# Patient Record
Sex: Male | Born: 1975 | Race: White | Hispanic: No | Marital: Married | Smoking: Never smoker
Health system: Southern US, Community
[De-identification: ages and names within clinical notes are randomized; demographics above are authoritative.]

## PROBLEM LIST (undated history)

## (undated) DIAGNOSIS — K922 Gastrointestinal hemorrhage, unspecified: Secondary | ICD-10-CM

---

## 1998-12-23 ENCOUNTER — Emergency Department (HOSPITAL_COMMUNITY): Admission: EM | Admit: 1998-12-23 | Discharge: 1998-12-23 | Payer: Self-pay | Admitting: Emergency Medicine

## 1998-12-23 ENCOUNTER — Encounter: Payer: Self-pay | Admitting: Emergency Medicine

## 2018-07-21 ENCOUNTER — Emergency Department (HOSPITAL_COMMUNITY): Payer: No Typology Code available for payment source

## 2018-07-21 ENCOUNTER — Emergency Department (HOSPITAL_COMMUNITY)
Admission: EM | Admit: 2018-07-21 | Discharge: 2018-07-21 | Disposition: A | Discharge status: Home / Self Care | Payer: No Typology Code available for payment source | Attending: Emergency Medicine | Admitting: Emergency Medicine

## 2018-07-21 ENCOUNTER — Encounter (HOSPITAL_COMMUNITY): Payer: Self-pay | Admitting: Emergency Medicine

## 2018-07-21 DIAGNOSIS — S0181XA Laceration without foreign body of other part of head, initial encounter: Secondary | ICD-10-CM

## 2018-07-21 DIAGNOSIS — S022XXA Fracture of nasal bones, initial encounter for closed fracture: Secondary | ICD-10-CM

## 2018-07-21 DIAGNOSIS — S01312A Laceration without foreign body of left ear, initial encounter: Secondary | ICD-10-CM | POA: Insufficient documentation

## 2018-07-21 DIAGNOSIS — S01511A Laceration without foreign body of lip, initial encounter: Secondary | ICD-10-CM | POA: Diagnosis not present

## 2018-07-21 DIAGNOSIS — Y929 Unspecified place or not applicable: Secondary | ICD-10-CM | POA: Insufficient documentation

## 2018-07-21 DIAGNOSIS — Z23 Encounter for immunization: Secondary | ICD-10-CM | POA: Diagnosis not present

## 2018-07-21 DIAGNOSIS — Y9355 Activity, bike riding: Secondary | ICD-10-CM | POA: Diagnosis not present

## 2018-07-21 DIAGNOSIS — Y999 Unspecified external cause status: Secondary | ICD-10-CM | POA: Diagnosis not present

## 2018-07-21 LAB — CBC
HCT: 36.5 % — ABNORMAL LOW (ref 39.0–52.0)
Hemoglobin: 10.2 g/dL — ABNORMAL LOW (ref 13.0–17.0)
MCH: 20.9 pg — ABNORMAL LOW (ref 26.0–34.0)
MCHC: 27.9 g/dL — ABNORMAL LOW (ref 30.0–36.0)
MCV: 74.8 fL — ABNORMAL LOW (ref 78.0–100.0)
Platelets: 316 10*3/uL (ref 150–400)
RBC: 4.88 MIL/uL (ref 4.22–5.81)
RDW: 16 % — ABNORMAL HIGH (ref 11.5–15.5)
WBC: 12.5 10*3/uL — ABNORMAL HIGH (ref 4.0–10.5)

## 2018-07-21 LAB — BASIC METABOLIC PANEL
Anion gap: 13 (ref 5–15)
BUN: 8 mg/dL (ref 6–20)
CO2: 22 mmol/L (ref 22–32)
Calcium: 8.9 mg/dL (ref 8.9–10.3)
Chloride: 107 mmol/L (ref 98–111)
Creatinine, Ser: 0.77 mg/dL (ref 0.61–1.24)
GFR calc Af Amer: >60 mL/min (ref >60)
GFR calc non Af Amer: >60 mL/min (ref >60)
Glucose, Bld: 95 mg/dL (ref 70–99)
Potassium: 3.5 mmol/L (ref 3.5–5.1)
Sodium: 142 mmol/L (ref 135–145)

## 2018-07-21 LAB — ETHANOL: Alcohol, Ethyl (B): 118 mg/dL — ABNORMAL HIGH (ref <10)

## 2018-07-21 MED ORDER — TETANUS-DIPHTH-ACELL PERTUSSIS 5-2.5-18.5 LF-MCG/0.5 IM SUSP
INTRAMUSCULAR | Status: AC
  Filled 2018-07-21: qty 0.5

## 2018-07-21 MED ORDER — LIDOCAINE-EPINEPHRINE (PF) 2 %-1:200000 IJ SOLN
20.0000 mL | Freq: Once | INTRAMUSCULAR | Status: DC
  Filled 2018-07-21: qty 20

## 2018-07-21 MED ORDER — IBUPROFEN 600 MG PO TABS: 600.0000 mg | ORAL_TABLET | Freq: Three times a day (TID) | ORAL | 0 refills | Status: DC | PRN

## 2018-07-21 MED ORDER — CEPHALEXIN 500 MG PO CAPS: 500.0000 mg | ORAL_CAPSULE | Freq: Three times a day (TID) | ORAL | 0 refills | Status: DC

## 2018-07-21 MED ORDER — TETANUS-DIPHTH-ACELL PERTUSSIS 5-2.5-18.5 LF-MCG/0.5 IM SUSP
0.5000 mL | Freq: Once | INTRAMUSCULAR | Status: AC
  Administered 2018-07-21: 0.5 mL via INTRAMUSCULAR

## 2018-07-21 MED ORDER — HYDROCODONE-ACETAMINOPHEN 5-325 MG PO TABS: 1.0000 | ORAL_TABLET | ORAL | 0 refills | Status: DC | PRN

## 2018-07-21 NOTE — Discharge Instructions (Addendum)
Suture removal in 7 days

## 2018-07-21 NOTE — ED Notes (Signed)
Pt was unsteady when walking stand by assit when walking.
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
## 2018-07-21 NOTE — ED Notes (Signed)
Xray at bedside for portables

## 2018-07-21 NOTE — ED Notes (Signed)
Dr. Patria Maneampos at bedside for wound repair
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
## 2018-07-21 NOTE — ED Notes (Signed)
Wounds was clean and pt is ready [place].

## 2021-09-15 ENCOUNTER — Encounter (HOSPITAL_COMMUNITY): Payer: Self-pay | Admitting: Emergency Medicine

## 2021-09-15 ENCOUNTER — Emergency Department (HOSPITAL_COMMUNITY)
Admission: EM | Admit: 2021-09-15 | Discharge: 2021-09-16 | Disposition: A | Discharge status: Home / Self Care | Payer: 59 | Attending: Emergency Medicine | Admitting: Emergency Medicine

## 2021-09-15 DIAGNOSIS — D649 Anemia, unspecified: Secondary | ICD-10-CM | POA: Insufficient documentation

## 2021-09-15 DIAGNOSIS — K625 Hemorrhage of anus and rectum: Secondary | ICD-10-CM | POA: Diagnosis not present

## 2021-09-15 LAB — CBC WITH DIFFERENTIAL/PLATELET
Abs Immature Granulocytes: 0.01 10*3/uL (ref 0.00–0.07)
Basophils Absolute: 0 10*3/uL (ref 0.0–0.1)
Basophils Relative: 0 %
Eosinophils Absolute: 0.2 10*3/uL (ref 0.0–0.5)
Eosinophils Relative: 2 %
HCT: 24.8 % — ABNORMAL LOW (ref 39.0–52.0)
Hemoglobin: 6.3 g/dL — CL (ref 13.0–17.0)
Immature Granulocytes: 0 %
Lymphocytes Relative: 25 %
Lymphs Abs: 2.1 10*3/uL (ref 0.7–4.0)
MCH: 16.4 pg — ABNORMAL LOW (ref 26.0–34.0)
MCHC: 25.4 g/dL — ABNORMAL LOW (ref 30.0–36.0)
MCV: 64.8 fL — ABNORMAL LOW (ref 80.0–100.0)
Monocytes Absolute: 0.5 10*3/uL (ref 0.1–1.0)
Monocytes Relative: 7 %
Neutro Abs: 5.4 10*3/uL (ref 1.7–7.7)
Neutrophils Relative %: 66 %
Platelets: 280 10*3/uL (ref 150–400)
RBC: 3.83 MIL/uL — ABNORMAL LOW (ref 4.22–5.81)
RDW: 18.1 % — ABNORMAL HIGH (ref 11.5–15.5)
WBC: 8.2 10*3/uL (ref 4.0–10.5)
nRBC: 0 % (ref 0.0–0.2)

## 2021-09-15 LAB — COMPREHENSIVE METABOLIC PANEL
ALT: 16 U/L (ref 0–44)
AST: 17 U/L (ref 15–41)
Albumin: 4.5 g/dL (ref 3.5–5.0)
Alkaline Phosphatase: 65 U/L (ref 38–126)
Anion gap: 7 (ref 5–15)
BUN: 16 mg/dL (ref 6–20)
CO2: 25 mmol/L (ref 22–32)
Calcium: 8.9 mg/dL (ref 8.9–10.3)
Chloride: 106 mmol/L (ref 98–111)
Creatinine, Ser: 0.72 mg/dL (ref 0.61–1.24)
GFR, Estimated: >60 mL/min (ref >60)
Glucose, Bld: 100 mg/dL — ABNORMAL HIGH (ref 70–99)
Potassium: 3.8 mmol/L (ref 3.5–5.1)
Sodium: 138 mmol/L (ref 135–145)
Total Bilirubin: 0.5 mg/dL (ref 0.3–1.2)
Total Protein: 7.3 g/dL (ref 6.5–8.1)

## 2021-09-15 LAB — PREPARE RBC (CROSSMATCH)

## 2021-09-15 LAB — ABO/RH: ABO/RH(D): B POS

## 2021-09-15 MED ORDER — DIPHENHYDRAMINE HCL 50 MG/ML IJ SOLN
25.0000 mg | Freq: Once | INTRAMUSCULAR | Status: AC
  Administered 2021-09-15: 25 mg via INTRAVENOUS
  Filled 2021-09-15: qty 1

## 2021-09-15 MED ORDER — SODIUM CHLORIDE 0.9 % IV SOLN
10.0000 mL/h | Freq: Once | INTRAVENOUS | Status: AC
  Administered 2021-09-15: 10 mL/h via INTRAVENOUS

## 2021-09-15 MED ORDER — ACETAMINOPHEN 500 MG PO TABS
1000.0000 mg | ORAL_TABLET | Freq: Once | ORAL | Status: AC
  Administered 2021-09-15: 1000 mg via ORAL
  Filled 2021-09-15: qty 2
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
## 2021-09-15 NOTE — ED Provider Notes (Signed)
[facility]-EMERGENCY DEPT Provider Note   CSN: [id number] Arrival date & time: 09/15/21  1651     History Chief Complaint  Patient presents with   Abnormal Lab    Eric Petty is a 45 y.o. male.  HPI Patient presents with his mother who is present throughout, assists with history. He presents from a primary care clinic where he initially went due to fatigue over the past few months, decreased exercise capacity over about the same timeframe.  No focal weakness, no syncope, no fall, no focal pain.  He notes that he has been experiencing blood in his stool recently.  Today he had physical exam, he states for the first time in about 20 years.  Labs performed resulted after he departed.  He was notified of critical abnormality of his hemoglobin value, sent here for evaluation.    History reviewed. No pertinent past medical history.  There are no problems to display for this patient.   History reviewed. No pertinent surgical history.     History reviewed. No pertinent family history.  Social History   Tobacco Use   Smoking status: Never   Smokeless tobacco: Never  Substance Use Topics   Alcohol use: Yes   Drug use: Never    Home Medications Prior to Admission medications   Medication Sig Start Date End Date Taking? Authorizing Provider  cephALEXin (KEFLEX) 500 MG capsule Take 1 capsule (500 mg total) by mouth 3 (three) times daily. 07/21/18   Azalia Bilis, MD  HYDROcodone-acetaminophen (NORCO/VICODIN) 5-325 MG tablet Take 1 tablet by mouth every 4 (four) hours as needed for moderate pain. 07/21/18   Azalia Bilis, MD  ibuprofen (ADVIL,MOTRIN) 600 MG tablet Take 1 tablet (600 mg total) by mouth every 8 (eight) hours as needed. 07/21/18   Azalia Bilis, MD    Allergies    Penicillins  Review of Systems   Review of Systems  Constitutional:        Per HPI, otherwise negative  HENT:         Per HPI, otherwise negative  Respiratory:         Per  HPI, otherwise negative  Cardiovascular:        Per HPI, otherwise negative  Gastrointestinal:  Positive for blood in stool. Negative for abdominal pain, diarrhea, nausea and vomiting.  Endocrine:       Negative aside from HPI  Genitourinary:        Neg aside from HPI   Musculoskeletal:        Per HPI, otherwise negative  Skin: Negative.   Neurological:  Positive for weakness and light-headedness. Negative for syncope.   Physical Exam Updated Vital Signs BP 129/89   Pulse 86   Temp (!) 100.4 F (38 C) (Oral)   Resp 19   SpO2 98%   Physical Exam Vitals and nursing note reviewed.  Constitutional:      General: He is not in acute distress.    Appearance: He is well-developed.  HENT:     Head: Normocephalic and atraumatic.  Eyes:     Conjunctiva/sclera: Conjunctivae normal.  Cardiovascular:     Rate and Rhythm: Normal rate and regular rhythm.  Pulmonary:     Effort: Pulmonary effort is normal. No respiratory distress.     Breath sounds: No stridor.  Abdominal:     General: There is no distension.     Tenderness: There is no abdominal tenderness. There is no guarding.  Skin:    General:  Skin is warm and dry.     Coloration: Skin is pale.  Neurological:     Mental Status: He is alert and oriented to person, place, and time.    ED Results / Procedures / Treatments   Labs (all labs ordered are listed, but only abnormal results are displayed) Labs Reviewed  CBC WITH DIFFERENTIAL/PLATELET - Abnormal; Notable for the following components:      Result Value   RBC 3.83 (*)    Hemoglobin 6.3 (*)    HCT 24.8 (*)    MCV 64.8 (*)    MCH 16.4 (*)    MCHC 25.4 (*)    RDW 18.1 (*)    All other components within normal limits  COMPREHENSIVE METABOLIC PANEL - Abnormal; Notable for the following components:   Glucose, Bld 100 (*)    All other components within normal limits  POC OCCULT BLOOD, ED  TYPE AND SCREEN  PREPARE RBC (CROSSMATCH)  ABO/RH      Procedures Procedures   Medications Ordered in ED Medications  0.9 %  sodium chloride infusion (0 mL/hr Intravenous Stopped 09/15/21 2250)  acetaminophen (TYLENOL) tablet 1,000 mg (1,000 mg Oral Given 09/15/21 2256)    ED Course  I have reviewed the triage vital signs and the nursing notes.  Pertinent labs & imaging results that were available during my care of the patient were reviewed by me and considered in my medical decision making (see chart for details).  Update: Initial hemoglobin 6.3 consistent with outside hospital lab value 6.6.  He and I discussed indication for transfusion.  Patient will proceed.  11:16 PM Transfusion complete.  Patient has tolerated without complication, has no hypotension, no complaints, is essentially ready to go, and we had a lengthy conversation about the need for very close outpatient follow-up with gastroenterology for colonoscopy appropriate ongoing monitoring, management with his bloody stool, symptomatic anemia.  Patient is amenable to this pathway.  However, soon after completing his transfusion patient found to have mild fever, requiring Tylenol.  Pending improvement in this, with no other complications patient may still be appropriate for discharge with outpatient GI follow-up.  Otherwise physical exam reassuring, patient is awake, alert, has no focal neurodeficits, no chest pain, no dyspnea, suspicion for his GI blood loss contributing to his symptomatic anemia, and as above he is aware of need for close outpatient follow-up for evaluation of GI bleed including possibilities such as polyps, diverticulosis, and/or cancer.  Absent abdominal pain, syncope, or other acute findings, colonoscopy is next appropriate diagnostic test, no indication for CT imaging currently. MDM Number of Diagnoses or Management Options Rectal bleeding: new, needed workup Symptomatic anemia: new, needed workup   Amount and/or Complexity of Data Reviewed Clinical lab  tests: ordered and reviewed Tests in the medicine section of CPT: reviewed and ordered Decide to obtain previous medical records or to obtain history from someone other than the patient: yes Obtain history from someone other than the patient: yes Review and summarize past medical records: yes Discuss the patient with other providers: yes  Risk of Complications, Morbidity, and/or Mortality Presenting problems: high Diagnostic procedures: high Management options: high  Critical Care Total time providing critical care: 30-74 minutes (35)  Patient Progress Patient progress: stable   Final Clinical Impression(s) / ED Diagnoses Final diagnoses:  Rectal bleeding  Symptomatic anemia     Gerhard Munch, MD 09/15/21 2318

## 2021-09-15 NOTE — Discharge Instructions (Addendum)
As discussed, with your bleeding, and hemoglobin abnormalities it is very important that you follow-up with your gastroenterology team for anticipated colonoscopy as well as appropriate ongoing outpatient monitoring and management of your episodes and weakness.  Return here for concerning changes in your condition.

## 2021-09-15 NOTE — ED Notes (Signed)
Consent signed and on chart.

## 2021-09-15 NOTE — ED Provider Notes (Signed)
Emergency Medicine Provider Triage Evaluation Note  Eric Petty , a 45 y.o. male  was evaluated in triage.  Pt complains of low hgb. Had a phsyical that showed hgb of 6.6. Has occasional red blood in stool. + Ice pica. Has had several months of fatigue and weakness.   Review of Systems  Positive: weakness Negative: fever  Physical Exam  BP 129/80 (BP Location: Left Arm)   Pulse 91   Temp (!) 97.5 F (36.4 C) (Oral)   Resp 18   SpO2 96%  Gen:   Awake, no distress   Resp:  Normal effort  MSK:   Moves extremities without difficulty  Other:  pale  Medical Decision Making  Medically screening exam initiated at 5:31 PM.  Appropriate orders placed.  Eric Petty was informed that the remainder of the evaluation will be completed by another provider, this initial triage assessment does not replace that evaluation, and the importance of remaining in the ED until their evaluation is complete.  Labs ordered.   Arthor Captain, PA-C 09/15/21 1734    Charlynne Pander, MD 09/15/21 [phone]

## 2021-09-16 LAB — TYPE AND SCREEN
ABO/RH(D): B POS
Antibody Screen: NEGATIVE
Unit division: 0

## 2021-09-16 LAB — BPAM RBC
Blood Product Expiration Date: 202211032359
ISSUE DATE / TIME: 202210132028
Unit Type and Rh: 7300

## 2021-09-16 NOTE — ED Notes (Signed)
Patient ambulated to the bathroom unassisted. Patient was given water to drink and is tolerating it well. Patient does not complain of any pain.

## 2021-09-23 ENCOUNTER — Other Ambulatory Visit: Payer: Self-pay

## 2021-09-23 ENCOUNTER — Encounter (HOSPITAL_COMMUNITY): Payer: Self-pay | Admitting: Emergency Medicine

## 2021-09-23 ENCOUNTER — Emergency Department (HOSPITAL_COMMUNITY): Payer: 59

## 2021-09-23 ENCOUNTER — Observation Stay (HOSPITAL_COMMUNITY)
Admission: EM | Admit: 2021-09-23 | Discharge: 2021-09-25 | Disposition: A | Discharge status: Home / Self Care | Payer: 59 | Attending: Internal Medicine | Admitting: Internal Medicine

## 2021-09-23 DIAGNOSIS — R531 Weakness: Secondary | ICD-10-CM

## 2021-09-23 DIAGNOSIS — Z20822 Contact with and (suspected) exposure to covid-19: Secondary | ICD-10-CM | POA: Diagnosis not present

## 2021-09-23 DIAGNOSIS — D500 Iron deficiency anemia secondary to blood loss (chronic): Secondary | ICD-10-CM | POA: Diagnosis not present

## 2021-09-23 DIAGNOSIS — K625 Hemorrhage of anus and rectum: Secondary | ICD-10-CM | POA: Diagnosis not present

## 2021-09-23 DIAGNOSIS — D62 Acute posthemorrhagic anemia: Secondary | ICD-10-CM | POA: Diagnosis present

## 2021-09-23 DIAGNOSIS — K648 Other hemorrhoids: Secondary | ICD-10-CM | POA: Diagnosis not present

## 2021-09-23 DIAGNOSIS — K922 Gastrointestinal hemorrhage, unspecified: Secondary | ICD-10-CM | POA: Diagnosis present

## 2021-09-23 DIAGNOSIS — K269 Duodenal ulcer, unspecified as acute or chronic, without hemorrhage or perforation: Secondary | ICD-10-CM | POA: Diagnosis not present

## 2021-09-23 DIAGNOSIS — D649 Anemia, unspecified: Secondary | ICD-10-CM | POA: Diagnosis present

## 2021-09-23 HISTORY — DX: Gastrointestinal hemorrhage, unspecified: K92.2

## 2021-09-23 LAB — COMPREHENSIVE METABOLIC PANEL
ALT: 19 U/L (ref 0–44)
AST: 26 U/L (ref 15–41)
Albumin: 4.2 g/dL (ref 3.5–5.0)
Alkaline Phosphatase: 66 U/L (ref 38–126)
Anion gap: 7 (ref 5–15)
BUN: 14 mg/dL (ref 6–20)
CO2: 23 mmol/L (ref 22–32)
Calcium: 8.8 mg/dL — ABNORMAL LOW (ref 8.9–10.3)
Chloride: 107 mmol/L (ref 98–111)
Creatinine, Ser: 0.82 mg/dL (ref 0.61–1.24)
GFR, Estimated: >60 mL/min (ref >60)
Glucose, Bld: 96 mg/dL (ref 70–99)
Potassium: 4.2 mmol/L (ref 3.5–5.1)
Sodium: 137 mmol/L (ref 135–145)
Total Bilirubin: 0.7 mg/dL (ref 0.3–1.2)
Total Protein: 6.9 g/dL (ref 6.5–8.1)

## 2021-09-23 LAB — CBC
HCT: 27.4 % — ABNORMAL LOW (ref 39.0–52.0)
Hemoglobin: 7.1 g/dL — ABNORMAL LOW (ref 13.0–17.0)
MCH: 17.4 pg — ABNORMAL LOW (ref 26.0–34.0)
MCHC: 25.9 g/dL — ABNORMAL LOW (ref 30.0–36.0)
MCV: 67.2 fL — ABNORMAL LOW (ref 80.0–100.0)
Platelets: 219 10*3/uL (ref 150–400)
RBC: 4.08 MIL/uL — ABNORMAL LOW (ref 4.22–5.81)
RDW: 19.8 % — ABNORMAL HIGH (ref 11.5–15.5)
WBC: 7.6 10*3/uL (ref 4.0–10.5)
nRBC: 0 % (ref 0.0–0.2)

## 2021-09-23 LAB — URINALYSIS, COMPLETE (UACMP) WITH MICROSCOPIC
Bacteria, UA: NONE SEEN
Bilirubin Urine: NEGATIVE
Glucose, UA: NEGATIVE mg/dL
Hgb urine dipstick: NEGATIVE
Ketones, ur: NEGATIVE mg/dL
Leukocytes,Ua: NEGATIVE
Nitrite: NEGATIVE
Protein, ur: NEGATIVE mg/dL
Specific Gravity, Urine: 1.027 (ref 1.005–1.030)
pH: 7 (ref 5.0–8.0)

## 2021-09-23 LAB — IRON AND TIBC
Iron: 11 ug/dL — ABNORMAL LOW (ref 45–182)
Saturation Ratios: 2 % — ABNORMAL LOW (ref 17.9–39.5)
TIBC: 577 ug/dL — ABNORMAL HIGH (ref 250–450)
UIBC: 566 ug/dL

## 2021-09-23 LAB — RETICULOCYTES
Immature Retic Fract: 25.7 % — ABNORMAL HIGH (ref 2.3–15.9)
RBC.: 4.11 MIL/uL — ABNORMAL LOW (ref 4.22–5.81)
Retic Count, Absolute: 51 10*3/uL (ref 19.0–186.0)
Retic Ct Pct: 1.2 % (ref 0.4–3.1)

## 2021-09-23 LAB — RESP PANEL BY RT-PCR (FLU A&B, COVID) ARPGX2
Influenza A by PCR: NEGATIVE
Influenza B by PCR: NEGATIVE
SARS Coronavirus 2 by RT PCR: NEGATIVE

## 2021-09-23 LAB — FOLATE: Folate: 13.2 ng/mL (ref >5.9)

## 2021-09-23 LAB — MAGNESIUM: Magnesium: 2.3 mg/dL (ref 1.7–2.4)

## 2021-09-23 LAB — PROTIME-INR
INR: 1 (ref 0.8–1.2)
Prothrombin Time: 13.6 seconds (ref 11.4–15.2)

## 2021-09-23 LAB — FERRITIN: Ferritin: 2 ng/mL — ABNORMAL LOW (ref 24–336)

## 2021-09-23 LAB — POC OCCULT BLOOD, ED

## 2021-09-23 LAB — PREPARE RBC (CROSSMATCH)

## 2021-09-23 LAB — TSH: TSH: 1.555 u[IU]/mL (ref 0.350–4.500)

## 2021-09-23 MED ORDER — ACETAMINOPHEN 325 MG PO TABS: 650.0000 mg | ORAL_TABLET | Freq: Four times a day (QID) | ORAL | Status: DC | PRN

## 2021-09-23 MED ORDER — ACETAMINOPHEN 650 MG RE SUPP: 650.0000 mg | Freq: Four times a day (QID) | RECTAL | Status: DC | PRN

## 2021-09-23 MED ORDER — IOHEXOL 350 MG/ML SOLN
80.0000 mL | Freq: Once | INTRAVENOUS | Status: AC | PRN
  Administered 2021-09-23: 80 mL via INTRAVENOUS

## 2021-09-23 MED ORDER — SODIUM CHLORIDE 0.9 % IV SOLN
10.0000 mL/h | Freq: Once | INTRAVENOUS | Status: AC
  Administered 2021-09-23: 10 mL/h via INTRAVENOUS

## 2021-09-23 NOTE — H&P (Signed)
History and Physical    PLEASE NOTE THAT DRAGON DICTATION SOFTWARE WAS USED IN THE CONSTRUCTION OF THIS NOTE.   Eric Petty DJS:[id number] DOB: [birth year] DOA: 09/23/2021  PCP: Pa, [place] Patient coming from: home   I have personally briefly reviewed patient's old medical records in [place]  Chief Complaint: low hemoglobin  HPI: Eric Petty is a 45 y.o. male without reported significant past medical history who is admitted to [facility] on 09/23/2021 with subacute on chronic lower GIB after presenting from home to [facility] ED at the instruction of outpatient GI for further evaluation of low hemoglobin.   The patient reports that he has been experiencing a daily episode of bright red blood per rectum associated with his daily bowel movement almost every day over the course of the last year.  He notes that this frequency has increased to 2 episodes of bright red blood per rectum per day over the last 1 to 2 months, which has been associated with development of generalized weakness in the absence of any acute weakness, numbness, paresthesias, facial droop, slurred speech, expressive aphasia, acute change in vision, dysphagia, vertigo.  They report that this new onset generalized weakness ultimately prompted him to present to was in the emergency department on 09/15/2021, at which time CBC demonstrated hemoglobin of 6.3, with most recent prior hemoglobin noted to be 10.2 in August 2019.  He was noted to be hemodynamically stable, and otherwise asymptomatic.  During the previous ED visit, he was transfused 1 unit PRBC for 2 outpatient gastroenterology for further evaluation of suspected subacute on chronic lower gastrointestinal bleed.  Upon presenting to [facility] gastroenterology clinic follow-up earlier today, repeat hemoglobin performed at that time, reportedly showed hemoglobin in the low sevens, gastroenterology for the patient to present back to [facility] emergency department for evaluation and management.   The patient denies any associated melena, nausea, vomiting, hematemesis.  He also denies any associated abdominal discomfort.  No known personal history of malignancy, denies any recent unintentional weight loss.  He also denies any recent chest pain, shortness of breath, palpitations, diaphoresis dizziness, presyncope, or syncope.  Not associate with any subjective fever, chills,'s, or generalized myalgias.   He conveys that a few years ago he was told that he had an internal hemorrhoid for which she was prescribed a topical agent, which he is states he did not take, but found the small amount of bright red blood per rectum that he was experiencing at that time resolved in the next few years, until resumption a little over 1 year ago.  Denies any history of prior endoscopic evaluation, specifically denying any prior EGD or colonoscopy.  He conveys that his alcohol consumption is limited to social settings, and specifically quantifies this by stating that he typically consumes 2 to 312 ounce beers 1-2 times per month, without any recent increase in frequency or volume of this consumption.  Denies any known underlying chronic liver disease.  No use of recreational drugs.  States that he will occasionally take a multivitamin, but otherwise denies use of any prescription, over-the-counter, or herbal medications.  He specifically denies taking any blood thinners as an outpatient, including no aspirin.  He also denies any use of NSAIDs, and also reports no use of BC's powder or Goody's powder.  Denies any recent dysuria, gross hematuria, or change in urinary urgency/frequency.  No hematemesis or hemoptysis.  Patient conveys that he does not routinely seek medical care,  and states that it has been several years before seeing his a few weeks ago was in the emergency department, as further detailed above.  He denies any known prior medical diagnoses with the  exception of the aforementioned internal hemorrhoid.   Aside from the hemoglobin draw at [facility] long ED on 09/15/2021, the only prior hemoglobin data point available per chart review at this time was noted to be 10.2 in August 2019.   ED Course:  Vital signs in the ED were notable for the following: Afebrile; heart rate 79-99; blood pressure 115/82 -139/81; respiratory rate 18, oxygen saturation 99 to 100% on room air.  Labs were notable for the following: CMP notable for following: Sodium of 137, BUN 14 compared to most recent prior value of 16 on 09/15/2021, creatinine of 0.2 relative to 0.72 on 09/15/2021, and liver enzymes were found to be within normal limits.  CBC notable for white cell count 7600, hemoglobin 11.1 associated with microcytic/hypochromic findings as well as an elevated RDW.  Evaluate type and screen was completed in the ED today.  DRE performed by EDP with fecal occult blood positive finding.  Screening COVID-19/influenza PCR in the ED today, currently pending.  Imaging and additional notable ED work-up: CT abdomen/pelvis with contrast showed no evidence of acute intra-abdominal or intrapelvic process, including no evidence of bowel wall thickening, distention, or inflammatory changes, and no mention of diverticulitis.  CT abdomen/pelvis for follow-up to the presence of without any evidence of acute cholecystitis or choledocholithiasis, also demonstrating nonobstructing kidney stone in the left kidney.  EDP discussed the patient's case with on-call [facility] gastroenterology, who will formally consult and evaluate the patient in person, at which time they will determine their recommendation regarding timing of colonoscopy, and do not recommend empiric initiation of bowel prep at this time.  While in the ED, transfusion of 2 units PRBC was initiated.  Symptoms, the patient was admitted for overnight observation to the PCU for further evaluation management of subacute on chronic lower  gastrointestinal bleed with associated subacute blood loss anemia.    Review of Systems: As per HPI otherwise 10 point review of systems negative.   Past Medical History:  Diagnosis Date   Acute lower GI bleeding    (seen in [facility] ED on 09/15/21 for this)    History reviewed. No pertinent surgical history.  Social History:  reports that he has never smoked. He has never used smokeless tobacco. He reports current alcohol use. He reports that he does not use drugs.   Allergies  Allergen Reactions   Penicillins Other (See Comments)    Unknown reaction, childhood allergy  Did it involve swelling of the face/tongue/throat, SOB, or low BP? Unknown Did it involve sudden or severe rash/hives, skin peeling, or any reaction on the inside of your mouth or nose? Unknown Did you need to seek medical attention at a hospital or doctor's office? Unknown When did it last happen?     childhood  If all above answers are "NO", may proceed with cephalosporin use.      Family history reviewed and not pertinent    Prior to Admission medications   Medication Sig Start Date End Date Taking? Authorizing Provider  Multiple Vitamin (MULTIVITAMIN ADULT) TABS Take 1 tablet by mouth daily.   Yes [provider]  cephALEXin (KEFLEX) 500 MG capsule Take 1 capsule (500 mg total) by mouth 3 (three) times daily. Patient not taking: No sig reported 07/21/18   Jola Schmidt, MD  HYDROcodone-acetaminophen (  NORCO/VICODIN) 5-325 MG tablet Take 1 tablet by mouth every 4 (four) hours as needed for moderate pain. Patient not taking: No sig reported 07/21/18   Jola Schmidt, MD  ibuprofen (ADVIL,MOTRIN) 600 MG tablet Take 1 tablet (600 mg total) by mouth every 8 (eight) hours as needed. Patient not taking: No sig reported 07/21/18   Jola Schmidt, MD     Objective    Physical Exam: Vitals:   09/23/21 1730 09/23/21 1800 09/23/21 1830 09/23/21 1900  BP: 121/80 115/82 120/79 (!) 125/92  Pulse: 87 80 86 79   Resp:      Temp:      TempSrc:      SpO2: 100% 100% 99% 100%    General: appears to be stated age; alert, oriented Skin: warm, dry, no rash Head:  AT/[place] Mouth:  Oral mucosa membranes appear moist, normal dentition Neck: supple; trachea midline Heart:  RRR; did not appreciate any M/R/G Lungs: CTAB, did not appreciate any wheezes, rales, or rhonchi Abdomen: + BS; soft, ND, NT Vascular: 2+ pedal pulses b/l; 2+ radial pulses b/l Extremities: no peripheral edema, no muscle wasting Neuro: strength and sensation intact in upper and lower extremities b/l   Labs on Admission: I have personally reviewed following labs and imaging studies  CBC: Recent Labs  Lab 09/23/21 1534  WBC 7.6  HGB 7.1*  HCT 27.4*  MCV 67.2*  PLT 902   Basic Metabolic Panel: Recent Labs  Lab 09/23/21 1534  NA 137  K 4.2  CL 107  CO2 23  GLUCOSE 96  BUN 14  CREATININE 0.82  CALCIUM 8.8*   GFR: CrCl cannot be calculated (Unknown ideal weight.). Liver Function Tests: Recent Labs  Lab 09/23/21 1534  AST 26  ALT 19  ALKPHOS 66  BILITOT 0.7  PROT 6.9  ALBUMIN 4.2   No results for input(s): LIPASE, AMYLASE in the last 168 hours. No results for input(s): AMMONIA in the last 168 hours. Coagulation Profile: No results for input(s): INR, PROTIME in the last 168 hours. Cardiac Enzymes: No results for input(s): CKTOTAL, CKMB, CKMBINDEX, TROPONINI in the last 168 hours. BNP (last 3 results) No results for input(s): PROBNP in the last 8760 hours. HbA1C: No results for input(s): HGBA1C in the last 72 hours. CBG: No results for input(s): GLUCAP in the last 168 hours. Lipid Profile: No results for input(s): CHOL, HDL, LDLCALC, TRIG, CHOLHDL, LDLDIRECT in the last 72 hours. Thyroid Function Tests: No results for input(s): TSH, T4TOTAL, FREET4, T3FREE, THYROIDAB in the last 72 hours. Anemia Panel: No results for input(s): VITAMINB12, FOLATE, FERRITIN, TIBC, IRON, RETICCTPCT in the last 72  hours. Urine analysis: No results found for: COLORURINE, APPEARANCEUR, LABSPEC, PHURINE, GLUCOSEU, HGBUR, BILIRUBINUR, KETONESUR, PROTEINUR, UROBILINOGEN, NITRITE, LEUKOCYTESUR  Radiological Exams on Admission: CT Abdomen Pelvis W Contrast  Result Date: 09/23/2021 CLINICAL DATA:  Low hemoglobin EXAM: CT ABDOMEN AND PELVIS WITH CONTRAST TECHNIQUE: Multidetector CT imaging of the abdomen and pelvis was performed using the standard protocol following bolus administration of intravenous contrast. CONTRAST:  45m OMNIPAQUE IOHEXOL 350 MG/ML SOLN COMPARISON:  None. FINDINGS: Lower chest: No acute abnormality. Hepatobiliary: Multiple calcified gallstones. No biliary dilatation or focal hepatic abnormality Pancreas: Unremarkable. No pancreatic ductal dilatation or surrounding inflammatory changes. Spleen: Upper normal in size.  Otherwise unremarkable Adrenals/Urinary Tract: Adrenal glands are normal. Prominent renal pelvises without convincing hydronephrosis or hydroureter. Small nonobstructing stone lower pole left kidney. The bladder is normal. Stomach/Bowel: Stomach is within normal limits. Appendix appears normal. No evidence of  bowel wall thickening, distention, or inflammatory changes. Vascular/Lymphatic: No significant vascular findings are present. No enlarged abdominal or pelvic lymph nodes. Reproductive: Prostate is unremarkable. Other: No free air or free fluid. Tiny fat containing umbilical hernia Musculoskeletal: No acute or significant osseous findings. IMPRESSION: 1. No CT evidence for acute intra-abdominal or pelvic abnormality. 2. Gallstones 3. Nonobstructing stone left kidney Electronically Signed   By: Donavan Foil M.D.   On: 09/23/2021 19:08      Assessment/Plan     Principal Problem:   Acute lower GI bleeding Active Problems:   Acute on chronic blood loss anemia   Generalized weakness   Bright red blood per rectum     #) Subacute on chronic lower GI bleed: diagnosis on the  basis of patient's report of greater than 1 year of daily episodes of bright red blood per rectum, with recent increase in frequency to daily episodes of such over the course of the last 1 to 2 months, now with development of generalized weakness.  Per chart review, there is a 4-year Before her most recent prior hemoglobin data point, complicating further narrowing of this timeframe of lower gastrointestinal bleeding relative to the patient's report of the above timeframe.  Given report of daily bright red blood per rectum for greater than 1 year with the patient remaining asymptomatic with the exception of mild generalized weakness, in the setting of hemodynamic stability and nonelevated BUN, source of bleed appears to be lower in nature.  Of note, DRE revealed Hemoccult positive stool in the ED today.   Not on any blood thinners as an outpatient, including no aspirin.  Alcohol consumption reportedly limited to 2 to 3 twelve ounce beers 1-2 times per month, without any recent increase in his frequency.  No known history of liver disease to warrant SBP prophylaxis..  Differential, probably, includes diverticular bleed, colorectal malignancy, bleeding colonic polyps, AVM's, hemorrhoids.. Given suspected lower GI source, there does not appear to be an indication for initiation of Protonix drip. Of note, patient was typed and screened in the ED today.  case discussed with the on-call [facility], who will formally consult and evaluate the patient in person, at which time they will determine their recommendation regarding timing of colonoscopy, and do not recommend empiric initiation of bowel prep at this time.   Presentation appears consistent with subacute blood loss anemia, as further quantified below, with initiation of transfusion of 2 units PRBC in the ED this evening.      Plan: Clear liquid diet. Refraining from pharmacologic DVT prophylaxis. Monitor on telemetry. Monitor continuous pulse-ox.  Maintain at least 2 large bore IV's. Check INR.  Q4H H&H's have been ordered through 9 AM on 09/24/2021. Will closely monitor these ensuing Hgb levels and correlate these data points with the patient's overall clinical picture including vital signs to determine need for additional transfusion beyond the 2 units PRBC that he is currently receiving.  Gastroenterology formally consulted, as above.  Further evaluation management of subacute blood loss anemia, including pursuit of iron studies, as further detailed below.    #) Subacute blood loss anemia: in the setting of suspected subacute on chronic lower GI bleed, as above, hemoglobin on 09/15/2021 was noted to be 6.3 with most recent prior hemoglobin occurring greater than 3 years prior in August 2019, at which time hemoglobin was noted to be 10.2.  The absence of hemoglobin data points in the greater than 3-year interval complicates evaluation of the chronicity of the patient's blood  loss anemia.  However, given the patient's report of development of generalized weakness/fatigue over the last 2 months, suspect that the timeframe is more subacute in onset.  After receiving transfusion of 1 unit PRBC on 09/15/2021, repeat hemoglobin today appears to reveal a relatively appropriate response to this 1 unit, with today's hemoglobin noted to be 7.1.  Aside from the aforementioned generalized weakness, the patient otherwise appears asymptomatic and hemodynamically stable, as further described above.  Today CBC associated with microcytic/hypochromic findings as well as elevated RDW, also consistent with blood loss anemia.  Given the chronicity with which the patient reports that he has been experiencing episodes of bright red blood per rectum, will add on iron studies, as the patient may also benefit from iron infusion in addition to PRBC transfusion if ensuing associated labs indicate depletion of iron stores.  Further expansion of laboratory evaluation patient's  anemia, as further described below.    Plan: work-up and management for presenting suspected subacute on chronic lower GI bleed, as above, including transfusion of 2 units PRBC with ensuing close monitoring of Q4H H&H's, as well as formal [facility] consultation to determine timeframe and location of recommended colonoscopy evaluation, as above.  monitor on telemetry. Monitor continuous pulse-ox. NPO. Refraining from pharmacologic DVT prophylaxis. Check INR. Add on the following to initial lab specimen collected in the ED today: total iron, TIBC, ferritin, MMA, folic acid level, reticulocyte count, and peripheral smear. Check ekg to establish baseline.       #) Generalized weakness: Patient reports approximately 2 months of new onset generalized weakness in the absence of any associated acute focal neurologic deficits to suggest acute versus subacute CVA.  Rather, suspect the timeframe of onset of the patient's generalized weakness is likely concomitant with subacute worsening of his chronic lower gastrointestinal bleed consistent with the timeframe of his reported increase in frequency of episodes of bright red blood per rectum, as further detailed above.  No evidence of underlying infectious process at this time, and criteria for sepsis not currently met.  We will check urinalysis to further evaluate.  Plan: Further evaluation and management presenting subacute on chronic lower gastrointestinal bleed with associated subacute blood loss anemia, including following for results of iron studies, MMA, B12 level, as above.  Check urinalysis, TSH.  Transfusion of 2 units PRBC, as above, with consideration for physical therapy consultation if patient's generalized weakness does not improve with these transfusion efforts.       DVT prophylaxis: scd's   Code Status: Full code Family Communication: Case was discussed with the patient's father, who is present at bedside Disposition Plan: Per  Rounding Team Consults called: on-call [facility] consulted, as further described above;  Admission status: Observation; PCU     Of note, this patient was added by me to the following Admit List/Treatment Team: [email or web address].    Of note, the Adult Admission Order Set (Multimorbid order set) was used by me in the admission process for this patient.   PLEASE NOTE THAT DRAGON DICTATION SOFTWARE WAS USED IN THE CONSTRUCTION OF THIS NOTE.   Rhetta Mura DO [facility] Pager [phone] From [place]   09/23/2021, 7:33 PM

## 2021-09-23 NOTE — ED Notes (Signed)
Consent to give Blood obtained by this RN

## 2021-09-23 NOTE — ED Provider Notes (Signed)
[facility]-EMERGENCY DEPT Provider Note   CSN: [id number] Arrival date & time: 09/23/21  1513     History Chief Complaint  Patient presents with   Abnormal Lab    Eric Petty is a 45 y.o. male.  Patient now with several month history of bright red blood per rectum.'s been increased lately.  Patient was seen in the emergency department October 13 for hemoglobin in the 6 range.  Received 1 unit of blood.  Had follow-up today with gastroenterology.  They did lab work that showed his hemoglobin was 7.1 and he was referred in here.  Patient continuing to have red blood per rectum about twice a day.  GI did do anal exam nothing obvious as the source.  Patient without any abdominal pain.  They referred him in.  Patient has colonoscopy scheduled for Friday a week from now.      History reviewed. No pertinent past medical history.  There are no problems to display for this patient.   History reviewed. No pertinent surgical history.     No family history on file.  Social History   Tobacco Use   Smoking status: Never   Smokeless tobacco: Never  Substance Use Topics   Alcohol use: Yes   Drug use: Never    Home Medications Prior to Admission medications   Medication Sig Start Date End Date Taking? Authorizing Provider  Multiple Vitamin (MULTIVITAMIN ADULT) TABS Take 1 tablet by mouth daily.   Yes [provider]  cephALEXin (KEFLEX) 500 MG capsule Take 1 capsule (500 mg total) by mouth 3 (three) times daily. Patient not taking: No sig reported 07/21/18   Azalia Bilis, MD  HYDROcodone-acetaminophen (NORCO/VICODIN) 5-325 MG tablet Take 1 tablet by mouth every 4 (four) hours as needed for moderate pain. Patient not taking: No sig reported 07/21/18   Azalia Bilis, MD  ibuprofen (ADVIL,MOTRIN) 600 MG tablet Take 1 tablet (600 mg total) by mouth every 8 (eight) hours as needed. Patient not taking: No sig reported 07/21/18   Azalia Bilis, MD     Allergies    Penicillins  Review of Systems   Review of Systems  Constitutional:  Negative for chills and fever.  HENT:  Negative for ear pain and sore throat.   Eyes:  Negative for pain and visual disturbance.  Respiratory:  Negative for cough and shortness of breath.   Cardiovascular:  Negative for chest pain and palpitations.  Gastrointestinal:  Positive for anal bleeding. Negative for abdominal pain and vomiting.  Genitourinary:  Negative for dysuria and hematuria.  Musculoskeletal:  Negative for arthralgias and back pain.  Skin:  Negative for color change and rash.  Neurological:  Negative for seizures and syncope.  All other systems reviewed and are negative.  Physical Exam Updated Vital Signs BP 115/82   Pulse 80   Temp 98 F (36.7 C) (Oral)   Resp 18   SpO2 100%   Physical Exam Vitals and nursing note reviewed.  Constitutional:      Appearance: Normal appearance. He is well-developed.  HENT:     Head: Normocephalic and atraumatic.  Eyes:     Extraocular Movements: Extraocular movements intact.     Conjunctiva/sclera: Conjunctivae normal.     Pupils: Pupils are equal, round, and reactive to light.  Cardiovascular:     Rate and Rhythm: Normal rate and regular rhythm.     Heart sounds: No murmur heard. Pulmonary:     Effort: Pulmonary effort is normal. No respiratory  distress.     Breath sounds: Normal breath sounds.  Abdominal:     Palpations: Abdomen is soft.     Tenderness: There is no abdominal tenderness.  Genitourinary:    Rectum: Guaiac result positive.  Musculoskeletal:        General: Normal range of motion.     Cervical back: Normal range of motion and neck supple.  Skin:    General: Skin is warm and dry.  Neurological:     General: No focal deficit present.     Mental Status: He is alert.    ED Results / Procedures / Treatments   Labs (all labs ordered are listed, but only abnormal results are displayed) Labs Reviewed  COMPREHENSIVE  METABOLIC PANEL - Abnormal; Notable for the following components:      Result Value   Calcium 8.8 (*)    All other components within normal limits  CBC - Abnormal; Notable for the following components:   RBC 4.08 (*)    Hemoglobin 7.1 (*)    HCT 27.4 (*)    MCV 67.2 (*)    MCH 17.4 (*)    MCHC 25.9 (*)    RDW 19.8 (*)    All other components within normal limits  POC OCCULT BLOOD, ED  POC OCCULT BLOOD, ED  TYPE AND SCREEN  PREPARE RBC (CROSSMATCH)    EKG None  Radiology No results found.  Procedures Procedures   Medications Ordered in ED Medications  0.9 %  sodium chloride infusion (has no administration in time range)  iohexol (OMNIPAQUE) 350 MG/ML injection 80 mL (80 mLs Intravenous Contrast Given 09/23/21 1835)    ED Course  I have reviewed the triage vital signs and the nursing notes.  Pertinent labs & imaging results that were available during my care of the patient were reviewed by me and considered in my medical decision making (see chart for details).    MDM Rules/Calculators/A&P                         CRITICAL CARE Performed by: Vanetta Mulders Total critical care time: 60 minutes Critical care time was exclusive of separately billable procedures and treating other patients. Critical care was necessary to treat or prevent imminent or life-threatening deterioration. Critical care was time spent personally by me on the following activities: development of treatment plan with patient and/or surrogate as well as nursing, discussions with consultants, evaluation of patient's response to treatment, examination of patient, obtaining history from patient or surrogate, ordering and performing treatments and interventions, ordering and review of laboratory studies, ordering and review of radiographic studies, pulse oximetry and re-evaluation of patient's condition.   Patient once again presenting with low hemoglobin.  Having persistent red blood per rectum.   Hemoglobin here today 7.1.  Patient just on October 13 received a unit of blood as an outpatient.  Being followed by [facility] planned for Friday a week from now.  This time feel patient should be admitted have ordered CT scan of the abdomen pelvis I will order 2 units of blood.  That way they can follow-up to make sure that the blood counts do not continue to drop.  Point-of-care testing was positive.  Positive Final Clinical Impression(s) / ED Diagnoses Final diagnoses:  Gastrointestinal hemorrhage, unspecified gastrointestinal hemorrhage type    Rx / DC Orders ED Discharge Orders     None        Vanetta Mulders, MD 09/23/21 1857

## 2021-09-23 NOTE — ED Triage Notes (Signed)
Patient here from home reporting low hgb today, received call after follow up from [facility]. Denies n/v. Did ger transfused on 10/13 with 1 unit.

## 2021-09-23 NOTE — Plan of Care (Signed)
Initiated [facility] and GI Bleed care plans, prior to patients arrival to floor

## 2021-09-24 DIAGNOSIS — K922 Gastrointestinal hemorrhage, unspecified: Secondary | ICD-10-CM | POA: Diagnosis not present

## 2021-09-24 LAB — MAGNESIUM: Magnesium: 2.1 mg/dL (ref 1.7–2.4)

## 2021-09-24 LAB — CBC
HCT: 31.7 % — ABNORMAL LOW (ref 39.0–52.0)
Hemoglobin: 9.2 g/dL — ABNORMAL LOW (ref 13.0–17.0)
MCH: 19.9 pg — ABNORMAL LOW (ref 26.0–34.0)
MCHC: 29 g/dL — ABNORMAL LOW (ref 30.0–36.0)
MCV: 68.5 fL — ABNORMAL LOW (ref 80.0–100.0)
Platelets: 300 10*3/uL (ref 150–400)
RBC: 4.63 MIL/uL (ref 4.22–5.81)
RDW: 22.2 % — ABNORMAL HIGH (ref 11.5–15.5)
WBC: 8.8 10*3/uL (ref 4.0–10.5)
nRBC: 0 % (ref 0.0–0.2)

## 2021-09-24 LAB — COMPREHENSIVE METABOLIC PANEL
ALT: 18 U/L (ref 0–44)
AST: 17 U/L (ref 15–41)
Albumin: 4 g/dL (ref 3.5–5.0)
Alkaline Phosphatase: 60 U/L (ref 38–126)
Anion gap: 6 (ref 5–15)
BUN: 10 mg/dL (ref 6–20)
CO2: 25 mmol/L (ref 22–32)
Calcium: 8.5 mg/dL — ABNORMAL LOW (ref 8.9–10.3)
Chloride: 107 mmol/L (ref 98–111)
Creatinine, Ser: 0.65 mg/dL (ref 0.61–1.24)
GFR, Estimated: >60 mL/min (ref >60)
Glucose, Bld: 99 mg/dL (ref 70–99)
Potassium: 3.7 mmol/L (ref 3.5–5.1)
Sodium: 138 mmol/L (ref 135–145)
Total Bilirubin: 2 mg/dL — ABNORMAL HIGH (ref 0.3–1.2)
Total Protein: 6.9 g/dL (ref 6.5–8.1)

## 2021-09-24 LAB — HEMOGLOBIN AND HEMATOCRIT, BLOOD
HCT: 31.7 % — ABNORMAL LOW (ref 39.0–52.0)
Hemoglobin: 9.2 g/dL — ABNORMAL LOW (ref 13.0–17.0)

## 2021-09-24 LAB — HIV ANTIBODY (ROUTINE TESTING W REFLEX): HIV Screen 4th Generation wRfx: NONREACTIVE

## 2021-09-24 LAB — PROTIME-INR
INR: 1.1 (ref 0.8–1.2)
Prothrombin Time: 14.4 seconds (ref 11.4–15.2)

## 2021-09-24 MED ORDER — PEG-KCL-NACL-NASULF-NA ASC-C 100 G PO SOLR
0.5000 | ORAL | Status: AC
  Administered 2021-09-24 – 2021-09-25 (×2): 100 g via ORAL
  Filled 2021-09-24: qty 1

## 2021-09-24 NOTE — H&P (View-Only) (Signed)
[facility] Consultation Note  Referring Provider: [facility] Primary Care Physician:  [facility]  Reason for Consultation:  blood in stool, anemia  HPI: Eric Petty is a 45 y.o. male presenting with blood in stool and anemia.  Has had occasional issues with hemorrhoids for many years.  However, over the past one month, patient has had progressive fatigue, dyspnea on exertion.  Has been found to have significant anemia (Hgb down to 6 range) with iron deficiency.  Blood on tissue paper and toilet bowel 1-2/day for several weeks.  No constipation, straining, abdominal pain.  Has gained about 20 lbs over the past year or two.  No dysphagia or melena.  No prior endoscopy or colonoscopy.  Father with polyps, but no family history of colon cancer, inflammatory bowel disease, or other GI illness.   Past Medical History:  Diagnosis Date   Acute lower GI bleeding    (seen in [facility] ED on 09/15/21 for this)    History reviewed. No pertinent surgical history.  Prior to Admission medications   Medication Sig Start Date End Date Taking? Authorizing Provider  Multiple Vitamin (MULTIVITAMIN ADULT) TABS Take 1 tablet by mouth daily.   Yes [provider]  cephALEXin (KEFLEX) 500 MG capsule Take 1 capsule (500 mg total) by mouth 3 (three) times daily. Patient not taking: No sig reported 07/21/18   Azalia Bilis, MD  HYDROcodone-acetaminophen (NORCO/VICODIN) 5-325 MG tablet Take 1 tablet by mouth every 4 (four) hours as needed for moderate pain. Patient not taking: No sig reported 07/21/18   Azalia Bilis, MD  ibuprofen (ADVIL,MOTRIN) 600 MG tablet Take 1 tablet (600 mg total) by mouth every 8 (eight) hours as needed. Patient not taking: No sig reported 07/21/18   Azalia Bilis, MD    Current Facility-Administered Medications  Medication Dose Route Frequency Provider Last Rate Last Admin   acetaminophen (TYLENOL) tablet 650 mg  650 mg Oral Q6H PRN  Howerter, Justin B, DO       Or   acetaminophen (TYLENOL) suppository 650 mg  650 mg Rectal Q6H PRN Howerter, Justin B, DO        Allergies as of 09/23/2021 - Review Complete 09/23/2021  Allergen Reaction Noted   Penicillins Other (See Comments) 07/21/2018    History reviewed. No pertinent family history.  Social History   Socioeconomic History   Marital status: Unknown    Spouse name: Not on file   Number of children: Not on file   Years of education: Not on file   Highest education level: Not on file  Occupational History   Not on file  Tobacco Use   Smoking status: Never   Smokeless tobacco: Never  Substance and Sexual Activity   Alcohol use: Yes    Comment: social; 2-3 beers on 2 occassions per month   Drug use: Never   Sexual activity: Not on file  Other Topics Concern   Not on file  Social History Narrative   Not on file   Social Determinants of Health   Financial Resource Strain: Not on file  Food Insecurity: Not on file  Transportation Needs: Not on file  Physical Activity: Not on file  Stress: Not on file  Social Connections: Not on file  Intimate Partner Violence: Not on file    Review of Systems: As per HPI, all others negative  Physical Exam: Vital signs in last 24 hours: Temp:  [97.8 F (36.6 C)-98.7 F (37.1 C)] 98.7 F (37.1 C) (  10/22 0421) Pulse Rate:  [68-99] 68 (10/22 0421) Resp:  [16-18] 16 (10/22 0421) BP: (115-139)/(71-92) 118/71 (10/22 0421) SpO2:  [98 %-100 %] 98 % (10/22 0421) Weight:  [90.5 kg] 90.5 kg (10/21 2127) Last BM Date: 09/23/21 General:   Alert,  Well-developed, well-nourished, pleasant and cooperative in NAD Head:  Normocephalic and atraumatic. Eyes:  Sclera clear, no icterus.   Conjunctiva pink. Ears:  Normal auditory acuity. Nose:  No deformity, discharge,  or lesions. Mouth:  No deformity or lesions.  Oropharynx pink & moist. Neck:  Supple; no masses or thyromegaly. Abdomen:  Soft, nontender and nondistended. No  masses, hepatosplenomegaly or hernias noted. Normal bowel sounds, without guarding, and without rebound.     Msk:  Symmetrical without gross deformities. Normal posture. Pulses:  Normal pulses noted. Extremities:  Without clubbing or edema. Neurologic:  Alert and  oriented x4;  grossly normal neurologically. Skin:  Intact without significant lesions or rashes. Psych:  Alert and cooperative. Normal mood and affect.   Lab Results: Recent Labs    09/23/21 1534 09/24/21 0524  WBC 7.6 8.8  HGB 7.1* 9.2*  9.2*  HCT 27.4* 31.7*  31.7*  PLT 219 300   BMET Recent Labs    09/23/21 1534 09/24/21 0524  NA 137 138  K 4.2 3.7  CL 107 107  CO2 23 25  GLUCOSE 96 99  BUN 14 10  CREATININE 0.82 0.65  CALCIUM 8.8* 8.5*   LFT Recent Labs    09/24/21 0524  PROT 6.9  ALBUMIN 4.0  AST 17  ALT 18  ALKPHOS 60  BILITOT 2.0*   PT/INR Recent Labs    09/23/21 1933 09/24/21 0524  LABPROT 13.6 14.4  INR 1.0 1.1    Studies/Results: CT Abdomen Pelvis W Contrast  Result Date: 09/23/2021 CLINICAL DATA:  Low hemoglobin EXAM: CT ABDOMEN AND PELVIS WITH CONTRAST TECHNIQUE: Multidetector CT imaging of the abdomen and pelvis was performed using the standard protocol following bolus administration of intravenous contrast. CONTRAST:  1mL OMNIPAQUE IOHEXOL 350 MG/ML SOLN COMPARISON:  None. FINDINGS: Lower chest: No acute abnormality. Hepatobiliary: Multiple calcified gallstones. No biliary dilatation or focal hepatic abnormality Pancreas: Unremarkable. No pancreatic ductal dilatation or surrounding inflammatory changes. Spleen: Upper normal in size.  Otherwise unremarkable Adrenals/Urinary Tract: Adrenal glands are normal. Prominent renal pelvises without convincing hydronephrosis or hydroureter. Small nonobstructing stone lower pole left kidney. The bladder is normal. Stomach/Bowel: Stomach is within normal limits. Appendix appears normal. No evidence of bowel wall thickening, distention, or  inflammatory changes. Vascular/Lymphatic: No significant vascular findings are present. No enlarged abdominal or pelvic lymph nodes. Reproductive: Prostate is unremarkable. Other: No free air or free fluid. Tiny fat containing umbilical hernia Musculoskeletal: No acute or significant osseous findings. IMPRESSION: 1. No CT evidence for acute intra-abdominal or pelvic abnormality. 2. Gallstones 3. Nonobstructing stone left kidney Electronically Signed   By: Jasmine Pang M.D.   On: 09/23/2021 19:08    Impression:   Iron deficiency anemia.  Suspect slow, but progressive, GI loss.  Blood in stool.  Plan:   Clear liquid diet only today.  Endoscopy and colonoscopy tomorrow.  Risks (bleeding, infection, bowel perforation that could require surgery, sedation-related changes in cardiopulmonary systems), benefits (identification and possible treatment of source of symptoms, exclusion of certain causes of symptoms), and alternatives (watchful waiting, radiographic imaging studies, empiric medical treatment) of upper endoscopy (EGD) were explained to patient/family in detail and patient wishes to proceed.  Risks (bleeding, infection, bowel perforation that could require  surgery, sedation-related changes in cardiopulmonary systems), benefits (identification and possible treatment of source of symptoms, exclusion of certain causes of symptoms), and alternatives (watchful waiting, radiographic imaging studies, empiric medical treatment) of colonoscopy were explained to patient/family in detail and patient wishes to proceed.   Next step in management pending endoscopy and colonoscopy findings.  [facility] will follow.   LOS: 0 days   Selwyn Reason M  09/24/2021, 10:16 AM  Cell [phone] If no answer or after 5 PM call [phone]

## 2021-09-24 NOTE — Consult Note (Signed)
[facility] Consultation Note  Referring Provider: [facility] Primary Care Physician:  [facility]  Reason for Consultation:  blood in stool, anemia  HPI: Eric Petty is a 45 y.o. male presenting with blood in stool and anemia.  Has had occasional issues with hemorrhoids for many years.  However, over the past one month, patient has had progressive fatigue, dyspnea on exertion.  Has been found to have significant anemia (Hgb down to 6 range) with iron deficiency.  Blood on tissue paper and toilet bowel 1-2/day for several weeks.  No constipation, straining, abdominal pain.  Has gained about 20 lbs over the past year or two.  No dysphagia or melena.  No prior endoscopy or colonoscopy.  Father with polyps, but no family history of colon cancer, inflammatory bowel disease, or other GI illness.   Past Medical History:  Diagnosis Date   Acute lower GI bleeding    (seen in [facility] ED on 09/15/21 for this)    History reviewed. No pertinent surgical history.  Prior to Admission medications   Medication Sig Start Date End Date Taking? Authorizing Provider  Multiple Vitamin (MULTIVITAMIN ADULT) TABS Take 1 tablet by mouth daily.   Yes [provider]  cephALEXin (KEFLEX) 500 MG capsule Take 1 capsule (500 mg total) by mouth 3 (three) times daily. Patient not taking: No sig reported 07/21/18   Campos, Kevin, MD  HYDROcodone-acetaminophen (NORCO/VICODIN) 5-325 MG tablet Take 1 tablet by mouth every 4 (four) hours as needed for moderate pain. Patient not taking: No sig reported 07/21/18   Campos, Kevin, MD  ibuprofen (ADVIL,MOTRIN) 600 MG tablet Take 1 tablet (600 mg total) by mouth every 8 (eight) hours as needed. Patient not taking: No sig reported 07/21/18   Campos, Kevin, MD    Current Facility-Administered Medications  Medication Dose Route Frequency Provider Last Rate Last Admin   acetaminophen (TYLENOL) tablet 650 mg  650 mg Oral Q6H PRN  Howerter, Justin B, DO       Or   acetaminophen (TYLENOL) suppository 650 mg  650 mg Rectal Q6H PRN Howerter, Justin B, DO        Allergies as of 09/23/2021 - Review Complete 09/23/2021  Allergen Reaction Noted   Penicillins Other (See Comments) 07/21/2018    History reviewed. No pertinent family history.  Social History   Socioeconomic History   Marital status: Unknown    Spouse name: Not on file   Number of children: Not on file   Years of education: Not on file   Highest education level: Not on file  Occupational History   Not on file  Tobacco Use   Smoking status: Never   Smokeless tobacco: Never  Substance and Sexual Activity   Alcohol use: Yes    Comment: social; 2-3 beers on 2 occassions per month   Drug use: Never   Sexual activity: Not on file  Other Topics Concern   Not on file  Social History Narrative   Not on file   Social Determinants of Health   Financial Resource Strain: Not on file  Food Insecurity: Not on file  Transportation Needs: Not on file  Physical Activity: Not on file  Stress: Not on file  Social Connections: Not on file  Intimate Partner Violence: Not on file    Review of Systems: As per HPI, all others negative  Physical Exam: Vital signs in last 24 hours: Temp:  [97.8 F (36.6 C)-98.7 F (37.1 C)] 98.7 F (37.1 C) (  10/22 0421) Pulse Rate:  [68-99] 68 (10/22 0421) Resp:  [16-18] 16 (10/22 0421) BP: (115-139)/(71-92) 118/71 (10/22 0421) SpO2:  [98 %-100 %] 98 % (10/22 0421) Weight:  [90.5 kg] 90.5 kg (10/21 2127) Last BM Date: 09/23/21 General:   Alert,  Well-developed, well-nourished, pleasant and cooperative in NAD Head:  Normocephalic and atraumatic. Eyes:  Sclera clear, no icterus.   Conjunctiva pink. Ears:  Normal auditory acuity. Nose:  No deformity, discharge,  or lesions. Mouth:  No deformity or lesions.  Oropharynx pink & moist. Neck:  Supple; no masses or thyromegaly. Abdomen:  Soft, nontender and nondistended. No  masses, hepatosplenomegaly or hernias noted. Normal bowel sounds, without guarding, and without rebound.     Msk:  Symmetrical without gross deformities. Normal posture. Pulses:  Normal pulses noted. Extremities:  Without clubbing or edema. Neurologic:  Alert and  oriented x4;  grossly normal neurologically. Skin:  Intact without significant lesions or rashes. Psych:  Alert and cooperative. Normal mood and affect.   Lab Results: Recent Labs    09/23/21 1534 09/24/21 0524  WBC 7.6 8.8  HGB 7.1* 9.2*  9.2*  HCT 27.4* 31.7*  31.7*  PLT 219 300   BMET Recent Labs    09/23/21 1534 09/24/21 0524  NA 137 138  K 4.2 3.7  CL 107 107  CO2 23 25  GLUCOSE 96 99  BUN 14 10  CREATININE 0.82 0.65  CALCIUM 8.8* 8.5*   LFT Recent Labs    09/24/21 0524  PROT 6.9  ALBUMIN 4.0  AST 17  ALT 18  ALKPHOS 60  BILITOT 2.0*   PT/INR Recent Labs    09/23/21 1933 09/24/21 0524  LABPROT 13.6 14.4  INR 1.0 1.1    Studies/Results: CT Abdomen Pelvis W Contrast  Result Date: 09/23/2021 CLINICAL DATA:  Low hemoglobin EXAM: CT ABDOMEN AND PELVIS WITH CONTRAST TECHNIQUE: Multidetector CT imaging of the abdomen and pelvis was performed using the standard protocol following bolus administration of intravenous contrast. CONTRAST:  1mL OMNIPAQUE IOHEXOL 350 MG/ML SOLN COMPARISON:  None. FINDINGS: Lower chest: No acute abnormality. Hepatobiliary: Multiple calcified gallstones. No biliary dilatation or focal hepatic abnormality Pancreas: Unremarkable. No pancreatic ductal dilatation or surrounding inflammatory changes. Spleen: Upper normal in size.  Otherwise unremarkable Adrenals/Urinary Tract: Adrenal glands are normal. Prominent renal pelvises without convincing hydronephrosis or hydroureter. Small nonobstructing stone lower pole left kidney. The bladder is normal. Stomach/Bowel: Stomach is within normal limits. Appendix appears normal. No evidence of bowel wall thickening, distention, or  inflammatory changes. Vascular/Lymphatic: No significant vascular findings are present. No enlarged abdominal or pelvic lymph nodes. Reproductive: Prostate is unremarkable. Other: No free air or free fluid. Tiny fat containing umbilical hernia Musculoskeletal: No acute or significant osseous findings. IMPRESSION: 1. No CT evidence for acute intra-abdominal or pelvic abnormality. 2. Gallstones 3. Nonobstructing stone left kidney Electronically Signed   By: Jasmine Pang M.D.   On: 09/23/2021 19:08    Impression:   Iron deficiency anemia.  Suspect slow, but progressive, GI loss.  Blood in stool.  Plan:   Clear liquid diet only today.  Endoscopy and colonoscopy tomorrow.  Risks (bleeding, infection, bowel perforation that could require surgery, sedation-related changes in cardiopulmonary systems), benefits (identification and possible treatment of source of symptoms, exclusion of certain causes of symptoms), and alternatives (watchful waiting, radiographic imaging studies, empiric medical treatment) of upper endoscopy (EGD) were explained to patient/family in detail and patient wishes to proceed.  Risks (bleeding, infection, bowel perforation that could require  surgery, sedation-related changes in cardiopulmonary systems), benefits (identification and possible treatment of source of symptoms, exclusion of certain causes of symptoms), and alternatives (watchful waiting, radiographic imaging studies, empiric medical treatment) of colonoscopy were explained to patient/family in detail and patient wishes to proceed.   Next step in management pending endoscopy and colonoscopy findings.  [facility] will follow.   LOS: 0 days   Britini Garcilazo M  09/24/2021, 10:16 AM  Cell [phone] If no answer or after 5 PM call [phone]  

## 2021-09-24 NOTE — Plan of Care (Signed)
  Problem: Activity: Goal: Risk for activity intolerance will decrease Outcome: Progressing   Problem: Nutrition: Goal: Adequate nutrition will be maintained Outcome: Progressing   Problem: Coping: Goal: Level of anxiety will decrease Outcome: Progressing   

## 2021-09-24 NOTE — Progress Notes (Signed)
PROGRESS NOTE    Eric Petty  VOZ:[id number] DOB: Dec 21, 1975 DOA: 09/23/2021 PCP: [facility]   Brief Narrative:  Eric Petty is a 45 y.o. male without reported significant past medical history who is admitted to [facility] on 09/23/2021 with subacute on chronic lower GIB after presenting from home to [facility] ED at the instruction of outpatient GI for further evaluation of low hemoglobin in the setting of BRBPR - well known to [facility] who has been consulted to follow.  Assessment & Plan:  Presumed acute recurrent lower GI bleed, POA Acute symptomatic blood loss anemia on chronic iron deficiency anemia, POA - [facility], [facility], to follow - likely scope in the next 24-48h - Follow H/H - s/p 2u PRBC since intake - Repeat hgb 9.2 this am (7.1 at intake)  Generalized weakness/debility secondary to above -Follow post transfusion; PT/OT on hold unless otherwise indicated  DVT prophylaxis: SCDs/early ambulation given above Code Status: Full Family Communication: Father at bedside  Status is: observation  Dispo: The patient is from: home              Anticipated d/c is to: home              Anticipated d/c date is: 24-48h              Patient currently not medically stable for discharge  Consultants:  [facility], [facility]  Procedures:  Endoscopy  Antimicrobials:  None   Subjective: No acute issues/events overnight - denies N/V headache, chest pain fevers or chills  Objective: Vitals:   09/24/21 0015 09/24/21 0030 09/24/21 0314 09/24/21 0421  BP: 128/77 121/76 116/79 118/71  Pulse:  84 72 68  Resp: 16 18 17 16   Temp: 98.6 F (37 C) 98.2 F (36.8 C) 97.8 F (36.6 C) 98.7 F (37.1 C)  TempSrc: Oral Oral Oral Oral  SpO2: 99% 99% 98% 98%  Weight:      Height:        Intake/Output Summary (Last 24 hours) at 09/24/2021 0705 Last data filed at 09/24/2021 0310 Gross per 24 hour  Intake 990 ml  Output 1000 ml  Net -10 ml   Filed Weights    09/23/21 2127  Weight: 90.5 kg    Examination:  General exam: Appears calm and comfortable  Respiratory system: Clear to auscultation. Respiratory effort normal. Cardiovascular system: S1 & S2 heard, RRR. No JVD, murmurs, rubs, gallops or clicks. No pedal edema. Gastrointestinal system: Abdomen is nondistended, soft and nontender. No organomegaly or masses felt. Normal bowel sounds heard. Central nervous system: Alert and oriented. No focal neurological deficits. Extremities: Symmetric 5 x 5 power. Skin: No rashes, lesions or ulcers Psychiatry: Judgement and insight appear normal. Mood & affect appropriate.     Data Reviewed: I have personally reviewed following labs and imaging studies  CBC: Recent Labs  Lab 09/23/21 1534 09/24/21 0524  WBC 7.6 8.8  HGB 7.1* 9.2*  9.2*  HCT 27.4* 31.7*  31.7*  MCV 67.2* 68.5*  PLT 219 300   Basic Metabolic Panel: Recent Labs  Lab 09/23/21 1534 09/23/21 1933 09/24/21 0524  NA 137  --  138  K 4.2  --  3.7  CL 107  --  107  CO2 23  --  25  GLUCOSE 96  --  99  BUN 14  --  10  CREATININE 0.82  --  0.65  CALCIUM 8.8*  --  8.5*  MG  --  2.3  2.1   GFR: Estimated Creatinine Clearance: 131 mL/min (by C-G formula based on SCr of 0.65 mg/dL). Liver Function Tests: Recent Labs  Lab 09/23/21 1534 09/24/21 0524  AST 26 17  ALT 19 18  ALKPHOS 66 60  BILITOT 0.7 2.0*  PROT 6.9 6.9  ALBUMIN 4.2 4.0   No results for input(s): LIPASE, AMYLASE in the last 168 hours. No results for input(s): AMMONIA in the last 168 hours. Coagulation Profile: Recent Labs  Lab 09/23/21 1933 09/24/21 0524  INR 1.0 1.1   Cardiac Enzymes: No results for input(s): CKTOTAL, CKMB, CKMBINDEX, TROPONINI in the last 168 hours. BNP (last 3 results) No results for input(s): PROBNP in the last 8760 hours. HbA1C: No results for input(s): HGBA1C in the last 72 hours. CBG: No results for input(s): GLUCAP in the last 168 hours. Lipid Profile: No results for  input(s): CHOL, HDL, LDLCALC, TRIG, CHOLHDL, LDLDIRECT in the last 72 hours. Thyroid Function Tests: Recent Labs    09/23/21 2030  TSH 1.555   Anemia Panel: Recent Labs    09/23/21 1957  FOLATE 13.2  FERRITIN 2*  TIBC 577*  IRON 11*  RETICCTPCT 1.2   Sepsis Labs: No results for input(s): PROCALCITON, LATICACIDVEN in the last 168 hours.  Recent Results (from the past 240 hour(s))  Resp Panel by RT-PCR (Flu A&B, Covid) Nasopharyngeal Swab     Status: None   Collection Time: 09/23/21  7:36 PM   Specimen: Nasopharyngeal Swab; Nasopharyngeal(NP) swabs in vial transport medium  Result Value Ref Range Status   SARS Coronavirus 2 by RT PCR NEGATIVE NEGATIVE Final    Comment: (NOTE) SARS-CoV-2 target nucleic acids are NOT DETECTED.  The SARS-CoV-2 RNA is generally detectable in upper respiratory specimens during the acute phase of infection. The lowest concentration of SARS-CoV-2 viral copies this assay can detect is 138 copies/mL. A negative result does not preclude SARS-Cov-2 infection and should not be used as the sole basis for treatment or other patient management decisions. A negative result may occur with  improper specimen collection/handling, submission of specimen other than nasopharyngeal swab, presence of viral mutation(s) within the areas targeted by this assay, and inadequate number of viral copies(<138 copies/mL). A negative result must be combined with clinical observations, patient history, and epidemiological information. The expected result is Negative.  Fact Sheet for Patients:  BloggerCourse.com  Fact Sheet for Healthcare Providers:  SeriousBroker.it  This test is no t yet approved or cleared by the [place] FDA and  has been authorized for detection and/or diagnosis of SARS-CoV-2 by FDA under an Emergency Use Authorization (EUA). This EUA will remain  in effect (meaning this test can be used) for the  duration of the COVID-19 declaration under Section 564(b)(1) of the Act, 21 U.S.C.section 360bbb-3(b)(1), unless the authorization is terminated  or revoked sooner.       Influenza A by PCR NEGATIVE NEGATIVE Final   Influenza B by PCR NEGATIVE NEGATIVE Final    Comment: (NOTE) The Xpert Xpress SARS-CoV-2/FLU/RSV plus assay is intended as an aid in the diagnosis of influenza from Nasopharyngeal swab specimens and should not be used as a sole basis for treatment. Nasal washings and aspirates are unacceptable for Xpert Xpress SARS-CoV-2/FLU/RSV testing.  Fact Sheet for Patients: BloggerCourse.com  Fact Sheet for Healthcare Providers: SeriousBroker.it  This test is not yet approved or cleared by the [place] FDA and has been authorized for detection and/or diagnosis of SARS-CoV-2 by FDA under an Emergency Use Authorization (EUA). This EUA  will remain in effect (meaning this test can be used) for the duration of the COVID-19 declaration under Section 564(b)(1) of the Act, 21 U.S.C. section 360bbb-3(b)(1), unless the authorization is terminated or revoked.  Performed at [facility], [address] [place], [place] [address]          Radiology Studies: CT Abdomen Pelvis W Contrast  Result Date: 09/23/2021 CLINICAL DATA:  Low hemoglobin EXAM: CT ABDOMEN AND PELVIS WITH CONTRAST TECHNIQUE: Multidetector CT imaging of the abdomen and pelvis was performed using the standard protocol following bolus administration of intravenous contrast. CONTRAST:  79mL OMNIPAQUE IOHEXOL 350 MG/ML SOLN COMPARISON:  None. FINDINGS: Lower chest: No acute abnormality. Hepatobiliary: Multiple calcified gallstones. No biliary dilatation or focal hepatic abnormality Pancreas: Unremarkable. No pancreatic ductal dilatation or surrounding inflammatory changes. Spleen: Upper normal in size.  Otherwise unremarkable Adrenals/Urinary Tract:  Adrenal glands are normal. Prominent renal pelvises without convincing hydronephrosis or hydroureter. Small nonobstructing stone lower pole left kidney. The bladder is normal. Stomach/Bowel: Stomach is within normal limits. Appendix appears normal. No evidence of bowel wall thickening, distention, or inflammatory changes. Vascular/Lymphatic: No significant vascular findings are present. No enlarged abdominal or pelvic lymph nodes. Reproductive: Prostate is unremarkable. Other: No free air or free fluid. Tiny fat containing umbilical hernia Musculoskeletal: No acute or significant osseous findings. IMPRESSION: 1. No CT evidence for acute intra-abdominal or pelvic abnormality. 2. Gallstones 3. Nonobstructing stone left kidney Electronically Signed   By: Jasmine Pang M.D.   On: 09/23/2021 19:08      LOS: 0 days   Time spent:  Azucena Fallen, DO [facility]  If 7PM-7AM, please contact night-coverage [email or web address]  09/24/2021, 7:05 AM

## 2021-09-25 ENCOUNTER — Encounter (HOSPITAL_COMMUNITY): Payer: Self-pay | Admitting: Internal Medicine

## 2021-09-25 ENCOUNTER — Observation Stay (HOSPITAL_COMMUNITY): Payer: 59 | Admitting: Anesthesiology

## 2021-09-25 ENCOUNTER — Encounter (HOSPITAL_COMMUNITY)
Admission: EM | Disposition: A | Discharge status: Home / Self Care | Payer: Self-pay | Source: Home / Self Care | Attending: Emergency Medicine

## 2021-09-25 DIAGNOSIS — K922 Gastrointestinal hemorrhage, unspecified: Secondary | ICD-10-CM | POA: Diagnosis not present

## 2021-09-25 HISTORY — PX: ESOPHAGOGASTRODUODENOSCOPY (EGD) WITH PROPOFOL: SHX5813

## 2021-09-25 HISTORY — PX: BIOPSY: SHX5522

## 2021-09-25 HISTORY — PX: COLONOSCOPY: SHX5424

## 2021-09-25 LAB — CBC
HCT: 33.6 % — ABNORMAL LOW (ref 39.0–52.0)
Hemoglobin: 9.6 g/dL — ABNORMAL LOW (ref 13.0–17.0)
MCH: 19.8 pg — ABNORMAL LOW (ref 26.0–34.0)
MCHC: 28.6 g/dL — ABNORMAL LOW (ref 30.0–36.0)
MCV: 69.4 fL — ABNORMAL LOW (ref 80.0–100.0)
Platelets: 305 10*3/uL (ref 150–400)
RBC: 4.84 MIL/uL (ref 4.22–5.81)
RDW: 22.9 % — ABNORMAL HIGH (ref 11.5–15.5)
WBC: 8.3 10*3/uL (ref 4.0–10.5)
nRBC: 0 % (ref 0.0–0.2)

## 2021-09-25 SURGERY — ESOPHAGOGASTRODUODENOSCOPY (EGD) WITH PROPOFOL
Anesthesia: Monitor Anesthesia Care

## 2021-09-25 MED ORDER — HYDROCORTISONE ACETATE 25 MG RE SUPP: 25.0000 mg | Freq: Two times a day (BID) | RECTAL | 0 refills | Status: DC

## 2021-09-25 MED ORDER — ONDANSETRON HCL 4 MG/2ML IJ SOLN
INTRAMUSCULAR | Status: DC | PRN
  Administered 2021-09-25: 4 mg via INTRAVENOUS

## 2021-09-25 MED ORDER — HYDROCORTISONE ACETATE 25 MG RE SUPP
25.0000 mg | Freq: Two times a day (BID) | RECTAL | Status: DC
  Filled 2021-09-25 (×2): qty 1

## 2021-09-25 MED ORDER — LACTATED RINGERS IV SOLN: INTRAVENOUS | Status: DC | PRN

## 2021-09-25 MED ORDER — PROPOFOL 10 MG/ML IV BOLUS
INTRAVENOUS | Status: DC | PRN
  Administered 2021-09-25 (×3): 50 mg via INTRAVENOUS

## 2021-09-25 MED ORDER — PHENYLEPHRINE HCL-NACL 20-0.9 MG/250ML-% IV SOLN
INTRAVENOUS | Status: DC | PRN
  Administered 2021-09-25: 25 ug/min via INTRAVENOUS

## 2021-09-25 MED ORDER — LACTATED RINGERS IV SOLN
INTRAVENOUS | Status: AC | PRN
  Administered 2021-09-25: 20 mL/h via INTRAVENOUS

## 2021-09-25 MED ORDER — PROPOFOL 500 MG/50ML IV EMUL
INTRAVENOUS | Status: DC | PRN
  Administered 2021-09-25: 125 ug/kg/min via INTRAVENOUS

## 2021-09-25 MED ORDER — LIDOCAINE 2% (20 MG/ML) 5 ML SYRINGE
INTRAMUSCULAR | Status: DC | PRN
  Administered 2021-09-25: 60 mg via INTRAVENOUS

## 2021-09-25 SURGICAL SUPPLY — 15 items

## 2021-09-25 NOTE — Anesthesia Preprocedure Evaluation (Addendum)
Anesthesia Evaluation  Patient identified by MRN, date of birth, ID band Patient awake    Reviewed: Allergy & Precautions, NPO status , Patient's Chart, lab work & pertinent test results  Airway Mallampati: II  TM Distance: >3 FB Neck ROM: Full    Dental  (+) Dental Advisory Given, Teeth Intact   Pulmonary neg pulmonary ROS,    Pulmonary exam normal breath sounds clear to auscultation       Cardiovascular negative cardio ROS Normal cardiovascular exam Rhythm:Regular Rate:Normal     Neuro/Psych negative neurological ROS     GI/Hepatic negative GI ROS, Neg liver ROS,   Endo/Other  negative endocrine ROS  Renal/GU negative Renal ROS     Musculoskeletal negative musculoskeletal ROS (+)   Abdominal   Peds  Hematology  (+) Blood dyscrasia, anemia ,   Anesthesia Other Findings   Reproductive/Obstetrics                           Anesthesia Physical Anesthesia Plan  ASA: 2  Anesthesia Plan:    Post-op Pain Management:    Induction: Intravenous  PONV Risk Score and Plan: 2 and Ondansetron, Propofol infusion, Treatment may vary due to age or medical condition and TIVA  Airway Management Planned: Natural Airway  Additional Equipment: None  Intra-op Plan:   Post-operative Plan:   Informed Consent: I have reviewed the patients History and Physical, chart, labs and discussed the procedure including the risks, benefits and alternatives for the proposed anesthesia with the patient or authorized representative who has indicated his/her understanding and acceptance.     Dental advisory given  Plan Discussed with: CRNA  Anesthesia Plan Comments:       Anesthesia Quick Evaluation

## 2021-09-25 NOTE — Anesthesia Postprocedure Evaluation (Signed)
Anesthesia Post Note  Patient: Eric Petty  Procedure(s) Performed: ESOPHAGOGASTRODUODENOSCOPY (EGD) WITH PROPOFOL COLONOSCOPY BIOPSY     Patient location during evaluation: PACU Anesthesia Type: MAC Level of consciousness: awake and alert Pain management: pain level controlled Vital Signs Assessment: post-procedure vital signs reviewed and stable Respiratory status: spontaneous breathing Cardiovascular status: stable Anesthetic complications: no   No notable events documented.  Last Vitals:  Vitals:   09/25/21 1100 09/25/21 1120  BP: 106/68 126/70  Pulse: 78 74  Resp: 16 18  Temp:  36.5 C  SpO2: 99% 99%    Last Pain:  Vitals:   09/25/21 1120  TempSrc: Oral  PainSc:                  Nolon Nations

## 2021-09-25 NOTE — Interval H&P Note (Signed)
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  The various methods of treatment have been discussed with the patient and family. After consideration of risks, benefits and other options for treatment, the patient has consented to  Procedure(s): ESOPHAGOGASTRODUODENOSCOPY (EGD) WITH PROPOFOL (N/A) and COLONOSCOPY WITH PROPOFOL as a surgical intervention.  The patient's history has been reviewed, patient examined, no change in status, stable for surgery.  I have reviewed the patient's chart and labs.  Questions were answered to the patient's satisfaction.     Eric Petty

## 2021-09-25 NOTE — Op Note (Signed)
[facility] Patient Name: Eric Petty Procedure Date: 09/25/2021 MRN: [id number] Attending MD: Willis Modena , MD Date of Birth: 21-Oct-1976 CSN: [id number] Age: 45 Admit Type: Inpatient Procedure:                Upper GI endoscopy Indications:              Iron deficiency anemia, Hematochezia Providers:                Willis Modena, MD, Vicki Mallet, RN, Janie                            Billups, Technician, Salley Scarlet, Technician,                            Heron Nay, CRNA Referring MD:             [facility] Medicines:                Monitored Anesthesia Care Complications:            No immediate complications. Estimated Blood Loss:     Estimated blood loss: none. Procedure:                Pre-Anesthesia Assessment:                           - Prior to the procedure, a History and Physical                            was performed, and patient medications and                            allergies were reviewed. The patient's tolerance of                            previous anesthesia was also reviewed. The risks                            and benefits of the procedure and the sedation                            options and risks were discussed with the patient.                            All questions were answered, and informed consent                            was obtained. Prior Anticoagulants: The patient has                            taken no previous anticoagulant or antiplatelet                            agents. ASA Grade Assessment: II - A patient with  mild systemic disease. After reviewing the risks                            and benefits, the patient was deemed in                            satisfactory condition to undergo the procedure.                           After obtaining informed consent, the endoscope was                            passed under direct vision. Throughout the                             procedure, the patient's blood pressure, pulse, and                            oxygen saturations were monitored continuously. The                            [id number]-[id number] ([id number]) Olympus endoscope was introduced                            through the mouth, and advanced to the second part                            of duodenum. The upper GI endoscopy was                            accomplished without difficulty. The patient                            tolerated the procedure well. Scope In: Scope Out: Findings:      The examined esophagus was normal.      The entire examined stomach was normal.      A few localized erosions without bleeding were found in the duodenal       bulb.      The first portion of the duodenum and second portion of the duodenum       were normal. Biopsies for histology were taken with a cold forceps for       evaluation of celiac disease.      No old or fresh blood was seen to the extent of our examination. Impression:               - Normal esophagus.                           - Normal stomach.                           - Duodenal erosions without bleeding.                           - Normal first portion of the duodenum and second  portion of the duodenum. Biopsied. Moderate Sedation:      None Recommendation:           - Await pathology results.                           - Perform a colonoscopy today. Procedure Code(s):        --- Professional ---                           [phone], Esophagogastroduodenoscopy, flexible,                            transoral; with biopsy, single or multiple Diagnosis Code(s):        --- Professional ---                           K26.9, Duodenal ulcer, unspecified as acute or                            chronic, without hemorrhage or perforation                           D50.9, Iron deficiency anemia, unspecified                           K92.1, Melena (includes Hematochezia) CPT copyright 2019  American Medical Association. All rights reserved. The codes documented in this report are preliminary and upon coder review may  be revised to meet current compliance requirements. Willis Modena, MD 09/25/2021 10:39:30 AM This report has been signed electronically. Number of Addenda: 0

## 2021-09-25 NOTE — Op Note (Signed)
[facility] Patient Name: Eric Petty Procedure Date: 09/25/2021 MRN: [id number] Attending MD: Willis Modena , MD Date of Birth: February 12, 1976 CSN: [id number] Age: 45 Admit Type: Inpatient Procedure:                Colonoscopy Indications:              This is the patient's first colonoscopy,                            Hematochezia, Iron deficiency anemia, CT abd/pelvis                            recently negative Providers:                Willis Modena, MD, Vicki Mallet, RN, Janie                            Billups, Technician, Salley Scarlet, Technician,                            Heron Nay, CRNA Referring MD:             [facility] Medicines:                Monitored Anesthesia Care Complications:            No immediate complications. Estimated Blood Loss:     Estimated blood loss: none. Procedure:                Pre-Anesthesia Assessment:                           - Prior to the procedure, a History and Physical                            was performed, and patient medications and                            allergies were reviewed. The patient's tolerance of                            previous anesthesia was also reviewed. The risks                            and benefits of the procedure and the sedation                            options and risks were discussed with the patient.                            All questions were answered, and informed consent                            was obtained. Prior Anticoagulants: The patient has                            taken no previous anticoagulant or antiplatelet  agents. ASA Grade Assessment: II - A patient with                            mild systemic disease. After reviewing the risks                            and benefits, the patient was deemed in                            satisfactory condition to undergo the procedure.                           - Prior to the  procedure, a History and Physical                            was performed, and patient medications and                            allergies were reviewed. The patient's tolerance of                            previous anesthesia was also reviewed. The risks                            and benefits of the procedure and the sedation                            options and risks were discussed with the patient.                            All questions were answered, and informed consent                            was obtained. Prior Anticoagulants: The patient has                            taken no previous anticoagulant or antiplatelet                            agents. ASA Grade Assessment: II - A patient with                            mild systemic disease. After reviewing the risks                            and benefits, the patient was deemed in                            satisfactory condition to undergo the procedure.                           After obtaining informed consent, the colonoscope  was passed under direct vision. Throughout the                            procedure, the patient's blood pressure, pulse, and                            oxygen saturations were monitored continuously. The                            [id number]-[id number] ([id number] Olympus colonoscope was                            introduced through the anus and advanced to the the                            terminal ileum, with identification of the                            appendiceal orifice and IC valve. The terminal                            ileum, ileocecal valve, appendiceal orifice, and                            rectum were photographed. The entire colon was                            examined. The colonoscopy was performed without                            difficulty. The patient tolerated the procedure                            well. The quality of the bowel preparation was  good. Scope In: 10:18:09 AM Scope Out: 10:32:09 AM Scope Withdrawal Time: 0 hours 11 minutes 17 seconds  Total Procedure Duration: 0 hours 14 minutes 0 seconds  Findings:      The perianal and digital rectal examinations were normal.      Internal hemorrhoids were found during retroflexion. The hemorrhoids       were moderate.      No additional abnormalities were found on retroflexion.      Colon otherwise normal; no other polyps, masses, vascular ectasias, or       inflammatory changes were seen.      The distal 10 cm of the terminal ileum appeared normal.      No old or fresh blood was seen to the extent of our examination. Impression:               - Internal hemorrhoids. Suspect this is cause of                            blood in stool. Unclear if he could have achieved                            such profound  iron-deficiency from hemorrhoids,                            raising possibility of some other cause (e.g.,                            celiac disease) of patient's iron-deficiency.                           - The examined portion of the ileum was normal.                           - Otherwise normal to terminal ileum. Moderate Sedation:      None Recommendation:           - Return patient to hospital ward for ongoing care.                           - Await pathology results.                           - Medical therapy (topical creams/suppositories),                            Sitz baths, high fiber diet, for hemorrhoids.                            Patient would likely benefit from outpatient                            evaluation by surgeons for possible hemorrhoidal                            banding.                           - [facility] will follow.                           - OK from GI perspective for patient to be                            discharged home with close outpatient follow-up. Procedure Code(s):        --- Professional ---                           [phone],  Colonoscopy, flexible; diagnostic, including                            collection of specimen(s) by brushing or washing,                            when performed (separate procedure) Diagnosis Code(s):        --- Professional ---                           J82.5,  Other hemorrhoids                           K92.1, Melena (includes Hematochezia)                           D50.9, Iron deficiency anemia, unspecified CPT copyright 2019 American Medical Association. All rights reserved. The codes documented in this report are preliminary and upon coder review may  be revised to meet current compliance requirements. Willis Modena, MD 09/25/2021 10:44:56 AM This report has been signed electronically. Number of Addenda: 0

## 2021-09-25 NOTE — Discharge Summary (Signed)
Physician Discharge Summary  SHAMEL GERMOND OEV:[id number] DOB: 07-25-1976 DOA: 09/23/2021  PCP: [facility]  Admit date: 09/23/2021 Discharge date: 09/25/2021  Admitted From: Home Disposition: Home  Recommendations for Outpatient Follow-up:  Follow up with PCP in 1-2 weeks Please obtain BMP/CBC in one week Please follow up with GI as scheduled  Home Health: None Equipment/Devices: None  Discharge Condition: Stable CODE STATUS: Full Diet recommendation: Per GI, advance as tolerated  Brief/Interim Summary: Eric Petty is a 45 y.o. male without reported significant past medical history who is admitted to [facility] on 09/23/2021 with subacute on chronic lower GIB after presenting from home to [facility] ED at the instruction of outpatient GI for further evaluation of low hemoglobin in the setting of BRBPR - well known to [facility] who has been consulted to follow.  Endoscopy revealed internal hemorrhoids likely the setting of patient's recurrent anemia and GI bleed, iron deficiency appears to be chronic as well likely exacerbating his symptomatology.  Hemoglobin otherwise stable, follow outpatient with PCP and GI as scheduled for repeat labs.   Assessment & Plan:   Presumed acute recurrent lower GI bleed, POA Acute symptomatic blood loss anemia on chronic iron deficiency anemia, POA - [facility], [facility], to follow - likely scope in the next 24-48h - H/H stable- s/p 2u PRBC since intake - Repeat hgb 9.2 this am (7.1 at intake)   Generalized weakness/debility secondary to above -Follow post transfusion; PT/OT on hold unless otherwise indicated  Discharge Instructions  Discharge Instructions     Call MD for:  extreme fatigue   Complete by: As directed    Diet - low sodium heart healthy   Complete by: As directed    Increase activity slowly   Complete by: As directed       Allergies as of 09/25/2021       Reactions   Penicillins Other (See  Comments)   Unknown reaction, childhood allergy Did it involve swelling of the face/tongue/throat, SOB, or low BP? Unknown Did it involve sudden or severe rash/hives, skin peeling, or any reaction on the inside of your mouth or nose? Unknown Did you need to seek medical attention at a hospital or doctor's office? Unknown When did it last happen?     childhood  If all above answers are "NO", may proceed with cephalosporin use.        Medication List     STOP taking these medications    cephALEXin 500 MG capsule Commonly known as: KEFLEX   HYDROcodone-acetaminophen 5-325 MG tablet Commonly known as: NORCO/VICODIN   ibuprofen 600 MG tablet Commonly known as: ADVIL       TAKE these medications    hydrocortisone 25 MG suppository Commonly known as: ANUSOL-HC Place 1 suppository (25 mg total) rectally 2 (two) times daily.   Multivitamin Adult Tabs Take 1 tablet by mouth daily.        Allergies  Allergen Reactions   Penicillins Other (See Comments)    Unknown reaction, childhood allergy  Did it involve swelling of the face/tongue/throat, SOB, or low BP? Unknown Did it involve sudden or severe rash/hives, skin peeling, or any reaction on the inside of your mouth or nose? Unknown Did you need to seek medical attention at a hospital or doctor's office? Unknown When did it last happen?     childhood  If all above answers are "NO", may proceed with cephalosporin use.      Consultations: [facility], [facility]  Procedures/Studies: CT Abdomen Pelvis W Contrast  Result Date: 09/23/2021 CLINICAL DATA:  Low hemoglobin EXAM: CT ABDOMEN AND PELVIS WITH CONTRAST TECHNIQUE: Multidetector CT imaging of the abdomen and pelvis was performed using the standard protocol following bolus administration of intravenous contrast. CONTRAST:  41mL OMNIPAQUE IOHEXOL 350 MG/ML SOLN COMPARISON:  None. FINDINGS: Lower chest: No acute abnormality. Hepatobiliary: Multiple calcified gallstones. No biliary  dilatation or focal hepatic abnormality Pancreas: Unremarkable. No pancreatic ductal dilatation or surrounding inflammatory changes. Spleen: Upper normal in size.  Otherwise unremarkable Adrenals/Urinary Tract: Adrenal glands are normal. Prominent renal pelvises without convincing hydronephrosis or hydroureter. Small nonobstructing stone lower pole left kidney. The bladder is normal. Stomach/Bowel: Stomach is within normal limits. Appendix appears normal. No evidence of bowel wall thickening, distention, or inflammatory changes. Vascular/Lymphatic: No significant vascular findings are present. No enlarged abdominal or pelvic lymph nodes. Reproductive: Prostate is unremarkable. Other: No free air or free fluid. Tiny fat containing umbilical hernia Musculoskeletal: No acute or significant osseous findings. IMPRESSION: 1. No CT evidence for acute intra-abdominal or pelvic abnormality. 2. Gallstones 3. Nonobstructing stone left kidney Electronically Signed   By: Jasmine Pang M.D.   On: 09/23/2021 19:08     Subjective: No acute issues or events overnight tolerated procedure well otherwise stable for discharge   Discharge Exam: Vitals:   09/25/21 1100 09/25/21 1120  BP: 106/68 126/70  Pulse: 78 74  Resp: 16 18  Temp:  97.7 F (36.5 C)  SpO2: 99% 99%   Vitals:   09/25/21 0926 09/25/21 1044 09/25/21 1100 09/25/21 1120  BP: 137/79 (!) 93/48 106/68 126/70  Pulse: 78 65 78 74  Resp: 17 18 16 18   Temp: 98.1 F (36.7 C) 98.1 F (36.7 C)  97.7 F (36.5 C)  TempSrc: Oral Axillary  Oral  SpO2: 100% 100% 99% 99%  Weight:      Height:        General: Pt is alert, awake, not in acute distress Cardiovascular: RRR, S1/S2 +, no rubs, no gallops Respiratory: CTA bilaterally, no wheezing, no rhonchi Abdominal: Soft, NT, ND, bowel sounds + Extremities: no edema, no cyanosis    The results of significant diagnostics from this hospitalization (including imaging, microbiology, ancillary and laboratory)  are listed below for reference.     Microbiology: Recent Results (from the past 240 hour(s))  Resp Panel by RT-PCR (Flu A&B, Covid) Nasopharyngeal Swab     Status: None   Collection Time: 09/23/21  7:36 PM   Specimen: Nasopharyngeal Swab; Nasopharyngeal(NP) swabs in vial transport medium  Result Value Ref Range Status   SARS Coronavirus 2 by RT PCR NEGATIVE NEGATIVE Final    Comment: (NOTE) SARS-CoV-2 target nucleic acids are NOT DETECTED.  The SARS-CoV-2 RNA is generally detectable in upper respiratory specimens during the acute phase of infection. The lowest concentration of SARS-CoV-2 viral copies this assay can detect is 138 copies/mL. A negative result does not preclude SARS-Cov-2 infection and should not be used as the sole basis for treatment or other patient management decisions. A negative result may occur with  improper specimen collection/handling, submission of specimen other than nasopharyngeal swab, presence of viral mutation(s) within the areas targeted by this assay, and inadequate number of viral copies(<138 copies/mL). A negative result must be combined with clinical observations, patient history, and epidemiological information. The expected result is Negative.  Fact Sheet for Patients:  09/25/21  Fact Sheet for Healthcare Providers:  BloggerCourse.com  This test is no t yet approved or cleared by the  [place] [employer] and  has been authorized for detection and/or diagnosis of SARS-CoV-2 by FDA under an TEFL teacher (EUA). This EUA will remain  in effect (meaning this test can be used) for the duration of the COVID-19 declaration under Section 564(b)(1) of the Act, 21 U.S.C.section 360bbb-3(b)(1), unless the authorization is terminated  or revoked sooner.       Influenza A by PCR NEGATIVE NEGATIVE Final   Influenza B by PCR NEGATIVE NEGATIVE Final    Comment: (NOTE) The Xpert Xpress  SARS-CoV-2/FLU/RSV plus assay is intended as an aid in the diagnosis of influenza from Nasopharyngeal swab specimens and should not be used as a sole basis for treatment. Nasal washings and aspirates are unacceptable for Xpert Xpress SARS-CoV-2/FLU/RSV testing.  Fact Sheet for Patients: BloggerCourse.com  Fact Sheet for Healthcare Providers: SeriousBroker.it  This test is not yet approved or cleared by the [place] FDA and has been authorized for detection and/or diagnosis of SARS-CoV-2 by FDA under an Emergency Use Authorization (EUA). This EUA will remain in effect (meaning this test can be used) for the duration of the COVID-19 declaration under Section 564(b)(1) of the Act, 21 U.S.C. section 360bbb-3(b)(1), unless the authorization is terminated or revoked.  Performed at [facility], [address] [place], [place] [address]      Labs: BNP (last 3 results) No results for input(s): BNP in the last 8760 hours. Basic Metabolic Panel: Recent Labs  Lab 09/23/21 1534 09/23/21 1933 09/24/21 0524  NA 137  --  138  K 4.2  --  3.7  CL 107  --  107  CO2 23  --  25  GLUCOSE 96  --  99  BUN 14  --  10  CREATININE 0.82  --  0.65  CALCIUM 8.8*  --  8.5*  MG  --  2.3 2.1   Liver Function Tests: Recent Labs  Lab 09/23/21 1534 09/24/21 0524  AST 26 17  ALT 19 18  ALKPHOS 66 60  BILITOT 0.7 2.0*  PROT 6.9 6.9  ALBUMIN 4.2 4.0   No results for input(s): LIPASE, AMYLASE in the last 168 hours. No results for input(s): AMMONIA in the last 168 hours. CBC: Recent Labs  Lab 09/23/21 1534 09/24/21 0524 09/25/21 0425  WBC 7.6 8.8 8.3  HGB 7.1* 9.2*  9.2* 9.6*  HCT 27.4* 31.7*  31.7* 33.6*  MCV 67.2* 68.5* 69.4*  PLT 219 300 305   Cardiac Enzymes: No results for input(s): CKTOTAL, CKMB, CKMBINDEX, TROPONINI in the last 168 hours. BNP: Invalid input(s): POCBNP CBG: No results for input(s):  GLUCAP in the last 168 hours. D-Dimer No results for input(s): DDIMER in the last 72 hours. Hgb A1c No results for input(s): HGBA1C in the last 72 hours. Lipid Profile No results for input(s): CHOL, HDL, LDLCALC, TRIG, CHOLHDL, LDLDIRECT in the last 72 hours. Thyroid function studies Recent Labs    09/23/21 2030  TSH 1.555   Anemia work up Recent Labs    09/23/21 1957  FOLATE 13.2  FERRITIN 2*  TIBC 577*  IRON 11*  RETICCTPCT 1.2   Urinalysis    Component Value Date/Time   COLORURINE YELLOW 09/23/2021 2200   APPEARANCEUR CLEAR 09/23/2021 2200   LABSPEC 1.027 09/23/2021 2200   PHURINE 7.0 09/23/2021 2200   GLUCOSEU NEGATIVE 09/23/2021 2200   HGBUR NEGATIVE 09/23/2021 2200   BILIRUBINUR NEGATIVE 09/23/2021 2200   KETONESUR NEGATIVE 09/23/2021 2200   PROTEINUR NEGATIVE 09/23/2021 2200   NITRITE  NEGATIVE 09/23/2021 2200   LEUKOCYTESUR NEGATIVE 09/23/2021 2200   Sepsis Labs Invalid input(s): PROCALCITONIN,  WBC,  LACTICIDVEN Microbiology Recent Results (from the past 240 hour(s))  Resp Panel by RT-PCR (Flu A&B, Covid) Nasopharyngeal Swab     Status: None   Collection Time: 09/23/21  7:36 PM   Specimen: Nasopharyngeal Swab; Nasopharyngeal(NP) swabs in vial transport medium  Result Value Ref Range Status   SARS Coronavirus 2 by RT PCR NEGATIVE NEGATIVE Final    Comment: (NOTE) SARS-CoV-2 target nucleic acids are NOT DETECTED.  The SARS-CoV-2 RNA is generally detectable in upper respiratory specimens during the acute phase of infection. The lowest concentration of SARS-CoV-2 viral copies this assay can detect is 138 copies/mL. A negative result does not preclude SARS-Cov-2 infection and should not be used as the sole basis for treatment or other patient management decisions. A negative result may occur with  improper specimen collection/handling, submission of specimen other than nasopharyngeal swab, presence of viral mutation(s) within the areas targeted by this  assay, and inadequate number of viral copies(<138 copies/mL). A negative result must be combined with clinical observations, patient history, and epidemiological information. The expected result is Negative.  Fact Sheet for Patients:  BloggerCourse.com  Fact Sheet for Healthcare Providers:  SeriousBroker.it  This test is no t yet approved or cleared by the [place] FDA and  has been authorized for detection and/or diagnosis of SARS-CoV-2 by FDA under an Emergency Use Authorization (EUA). This EUA will remain  in effect (meaning this test can be used) for the duration of the COVID-19 declaration under Section 564(b)(1) of the Act, 21 U.S.C.section 360bbb-3(b)(1), unless the authorization is terminated  or revoked sooner.       Influenza A by PCR NEGATIVE NEGATIVE Final   Influenza B by PCR NEGATIVE NEGATIVE Final    Comment: (NOTE) The Xpert Xpress SARS-CoV-2/FLU/RSV plus assay is intended as an aid in the diagnosis of influenza from Nasopharyngeal swab specimens and should not be used as a sole basis for treatment. Nasal washings and aspirates are unacceptable for Xpert Xpress SARS-CoV-2/FLU/RSV testing.  Fact Sheet for Patients: BloggerCourse.com  Fact Sheet for Healthcare Providers: SeriousBroker.it  This test is not yet approved or cleared by the [place] FDA and has been authorized for detection and/or diagnosis of SARS-CoV-2 by FDA under an Emergency Use Authorization (EUA). This EUA will remain in effect (meaning this test can be used) for the duration of the COVID-19 declaration under Section 564(b)(1) of the Act, 21 U.S.C. section 360bbb-3(b)(1), unless the authorization is terminated or revoked.  Performed at [facility], [address], [place], [place] [address]      Time coordinating discharge: Over 30  minutes  SIGNED:   Azucena Fallen, DO [facility] 09/25/2021, 12:56 PM Pager   If 7PM-7AM, please contact night-coverage [email or web address]

## 2021-09-25 NOTE — Transfer of Care (Signed)
Immediate Anesthesia Transfer of Care Note  Patient: Eric Petty  Procedure(s) Performed: ESOPHAGOGASTRODUODENOSCOPY (EGD) WITH PROPOFOL COLONOSCOPY BIOPSY  Patient Location: PACU  Anesthesia Type:MAC  Level of Consciousness: sedated  Airway & Oxygen Therapy: Patient Spontanous Breathing and Patient connected to face mask oxygen  Post-op Assessment: Report given to RN and Post -op Vital signs reviewed and stable  Post vital signs: Reviewed and stable  Last Vitals:  Vitals Value Taken Time  BP 93/48 09/25/21 1044  Temp    Pulse 65 09/25/21 1044  Resp 18 09/25/21 1044  SpO2      Last Pain:  Vitals:   09/25/21 0926  TempSrc: Oral  PainSc: 0-No pain         Complications: No notable events documented.

## 2021-09-25 NOTE — Anesthesia Procedure Notes (Signed)
Procedure Name: MAC Date/Time: 09/25/2021 9:56 AM Performed by: Cynda Familia, CRNA Pre-anesthesia Checklist: Patient identified, Emergency Drugs available, Suction available, Patient being monitored and Timeout performed Patient Re-evaluated:Patient Re-evaluated prior to induction Oxygen Delivery Method: Simple face mask Placement Confirmation: positive ETCO2 and breath sounds checked- equal and bilateral Dental Injury: Teeth and Oropharynx as per pre-operative assessment

## 2021-09-26 ENCOUNTER — Encounter (HOSPITAL_COMMUNITY): Payer: Self-pay | Admitting: Gastroenterology

## 2021-09-26 LAB — TYPE AND SCREEN
ABO/RH(D): B POS
Antibody Screen: NEGATIVE
Unit division: 0
Unit division: 0

## 2021-09-26 LAB — BPAM RBC
Blood Product Expiration Date: 202210242359
Blood Product Expiration Date: 202211042359
ISSUE DATE / TIME: 202210212033
ISSUE DATE / TIME: 202210220002
Unit Type and Rh: 1700
Unit Type and Rh: 7300

## 2021-09-26 LAB — PATHOLOGIST SMEAR REVIEW

## 2021-09-27 LAB — SURGICAL PATHOLOGY

## 2021-09-27 LAB — METHYLMALONIC ACID, SERUM: Methylmalonic Acid, Quantitative: 108 nmol/L (ref 0–378)

## 2022-05-05 IMAGING — CT CT ABD-PELV W/ CM
3 of 5 series · 17 of 46 positions shown, 19 images · IV contrast (omnipaque)
Comparison: None.

CLINICAL DATA: Low hemoglobin

EXAM:
CT ABDOMEN AND PELVIS WITH CONTRAST
TECHNIQUE: Multidetector CT imaging of the abdomen and pelvis was performed
using the standard protocol following bolus administration of
intravenous contrast.
CONTRAST:  80mL OMNIPAQUE IOHEXOL 350 MG/ML SOLN

[Series 2: axial st · axial · 0.80mm/px · z∈[+1171,+1561]mm · 11 of 94 slices shown, 13 images]
[im 8/94  soft-tissue]
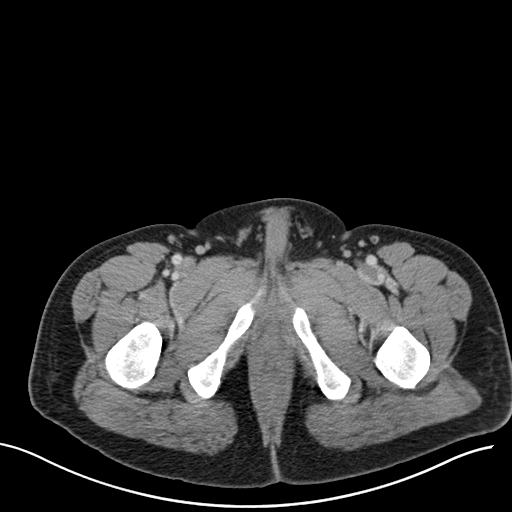
[im 8/94  bone]
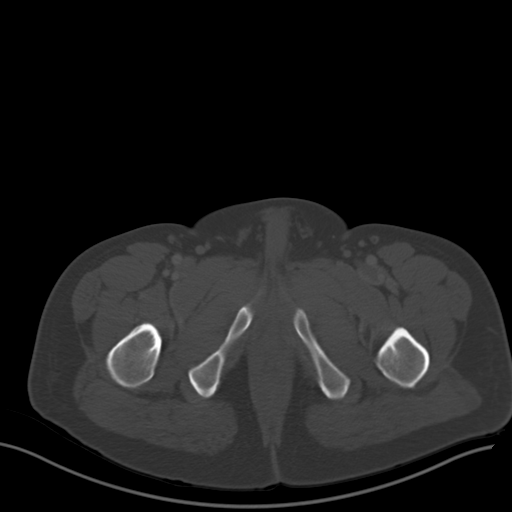
[im 16/94  soft-tissue]
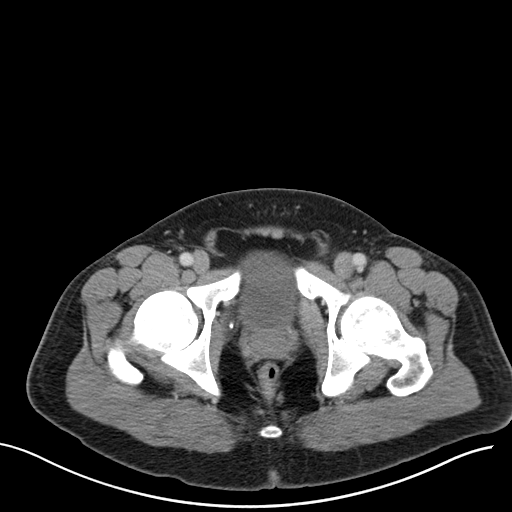
[im 24/94  soft-tissue]
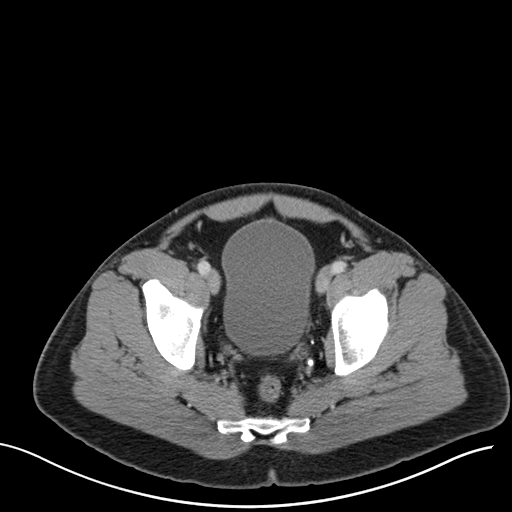
[im 32/94  soft-tissue]
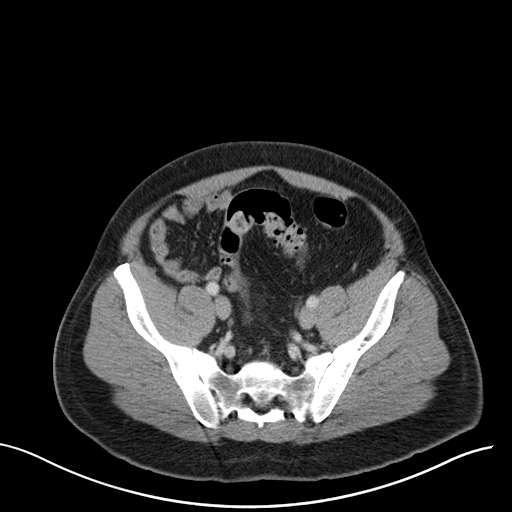
[im 39/94  soft-tissue]
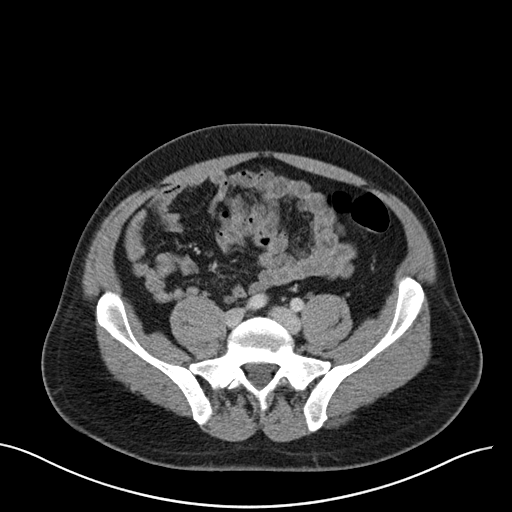
[im 47/94  soft-tissue]
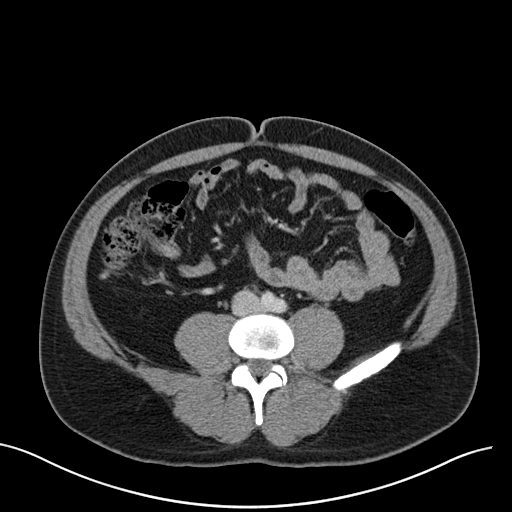
[im 55/94  soft-tissue]
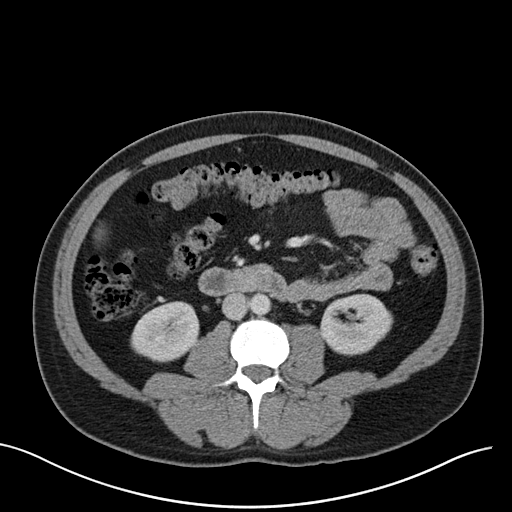
[im 63/94  soft-tissue]
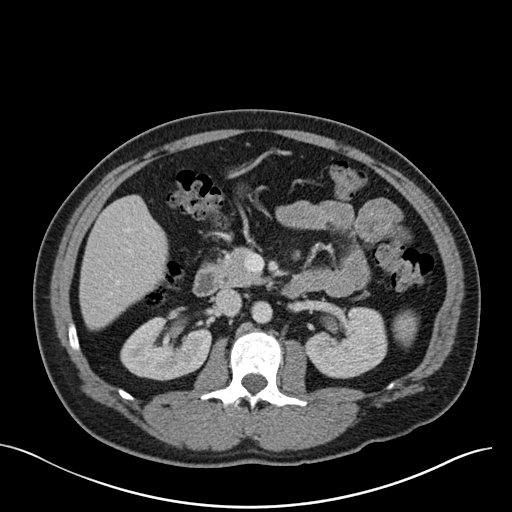
[im 70/94  soft-tissue]
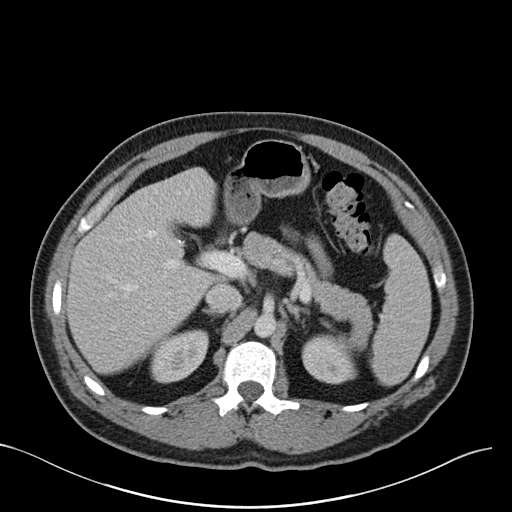
[im 70/94  bone]
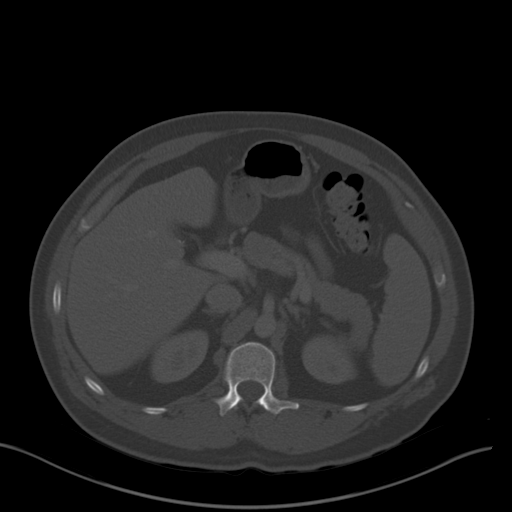
[im 78/94  soft-tissue]
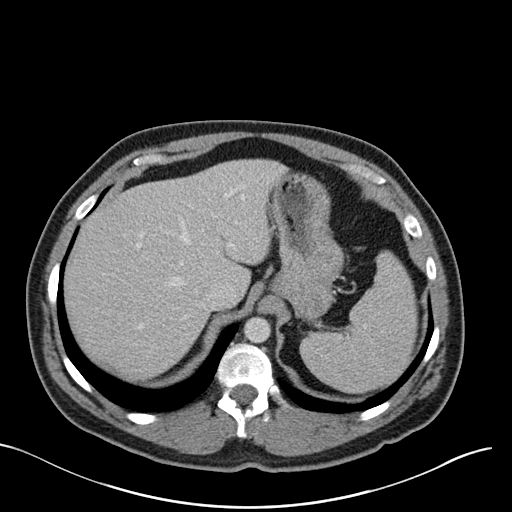
[im 86/94  soft-tissue]
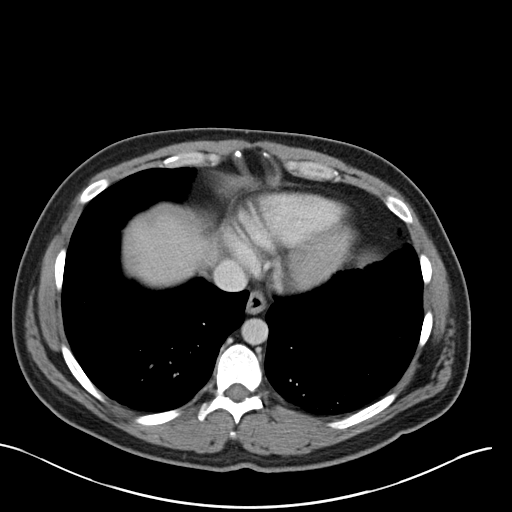

[Series 4: lung bases · axial · 0.80mm/px · z∈[+1423,+1471]mm · 3 of 98 slices shown]
[im 9/98  bone]
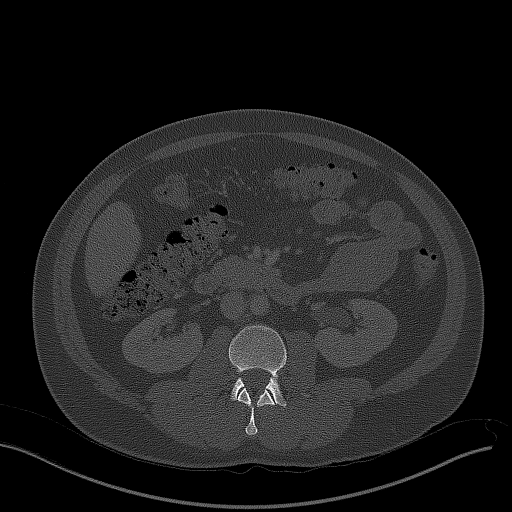
[im 25/98  bone]
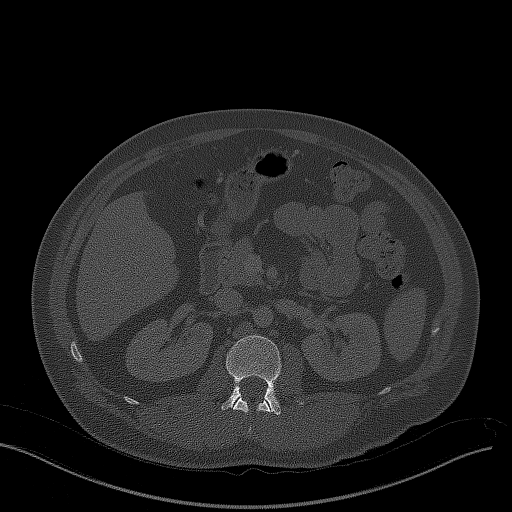
[im 33/98  bone]
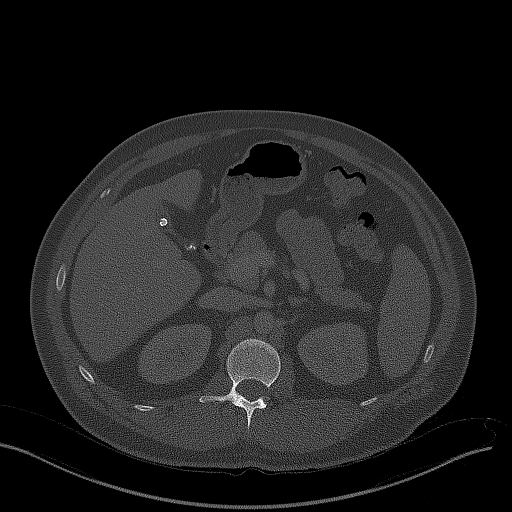

[Series 5: coronal st · coronal · 0.84mm/px · 3 of 151 slices shown]
[im 51/151  soft-tissue]
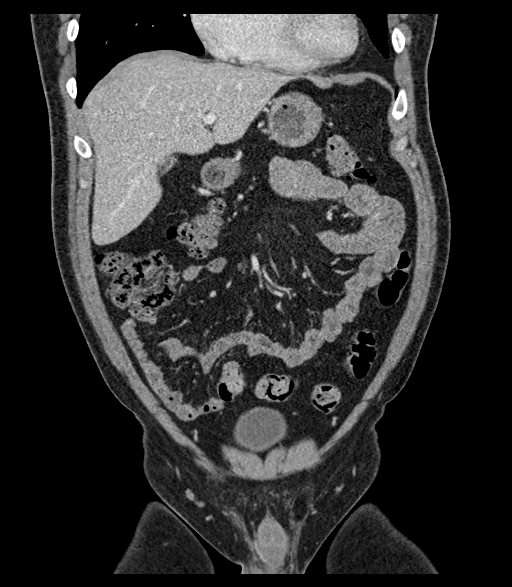
[im 67/151  soft-tissue]
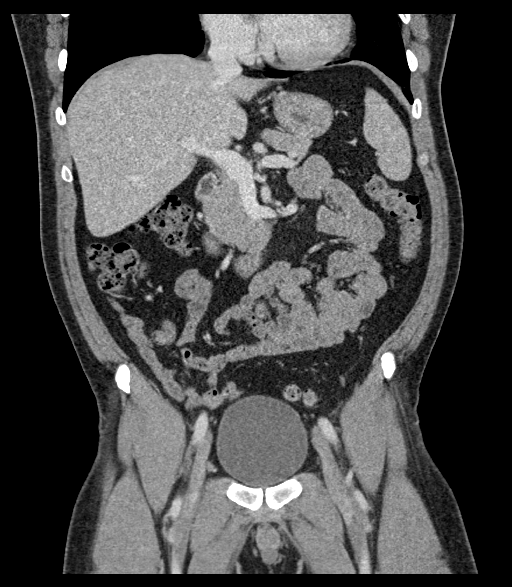
[im 84/151  soft-tissue]
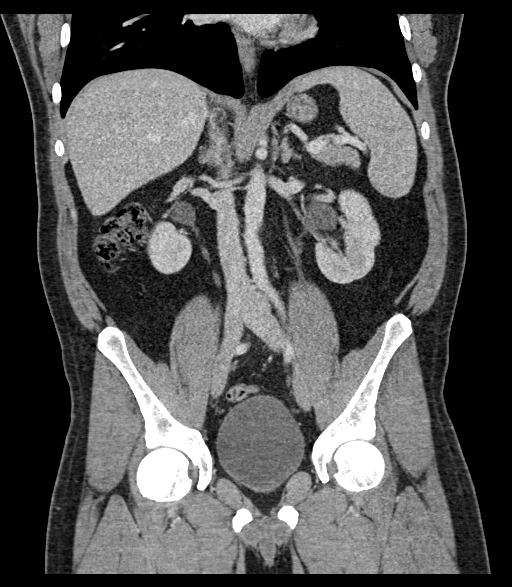

[17 of 46 positions shown; findings below may reference images not displayed]

FINDINGS: Lower chest: No acute abnormality.

Hepatobiliary: Multiple calcified gallstones. No biliary dilatation
or focal hepatic abnormality

Pancreas: Unremarkable. No pancreatic ductal dilatation or
surrounding inflammatory changes.

Spleen: Upper normal in size.  Otherwise unremarkable

Adrenals/Urinary Tract: Adrenal glands are normal. Prominent renal
pelvises without convincing hydronephrosis or hydroureter. Small
nonobstructing stone lower pole left kidney. The bladder is normal.

Stomach/Bowel: Stomach is within normal limits. Appendix appears
normal. No evidence of bowel wall thickening, distention, or
inflammatory changes.

Vascular/Lymphatic: No significant vascular findings are present. No
enlarged abdominal or pelvic lymph nodes.

Reproductive: Prostate is unremarkable.

Other: No free air or free fluid. Tiny fat containing umbilical
hernia

Musculoskeletal: No acute or significant osseous findings.
IMPRESSION: 1. No CT evidence for acute intra-abdominal or pelvic abnormality.
2. Gallstones
3. Nonobstructing stone left kidney

## 2023-09-28 ENCOUNTER — Other Ambulatory Visit: Payer: Self-pay

## 2023-09-28 ENCOUNTER — Observation Stay (HOSPITAL_COMMUNITY)
Admission: EM | Admit: 2023-09-28 | Discharge: 2023-09-29 | Disposition: A | Discharge status: Home / Self Care | Payer: 59 | Attending: Emergency Medicine | Admitting: Emergency Medicine

## 2023-09-28 ENCOUNTER — Encounter (HOSPITAL_COMMUNITY): Payer: Self-pay

## 2023-09-28 DIAGNOSIS — D649 Anemia, unspecified: Principal | ICD-10-CM

## 2023-09-28 DIAGNOSIS — K922 Gastrointestinal hemorrhage, unspecified: Secondary | ICD-10-CM | POA: Diagnosis not present

## 2023-09-28 DIAGNOSIS — D500 Iron deficiency anemia secondary to blood loss (chronic): Secondary | ICD-10-CM | POA: Insufficient documentation

## 2023-09-28 DIAGNOSIS — E876 Hypokalemia: Secondary | ICD-10-CM | POA: Insufficient documentation

## 2023-09-28 LAB — CBC
HCT: 27.7 % — ABNORMAL LOW (ref 39.0–52.0)
HCT: 28 % — ABNORMAL LOW (ref 39.0–52.0)
Hemoglobin: 6.7 g/dL — CL (ref 13.0–17.0)
Hemoglobin: 7.1 g/dL — ABNORMAL LOW (ref 13.0–17.0)
MCH: 14.7 pg — ABNORMAL LOW (ref 26.0–34.0)
MCH: 16.1 pg — ABNORMAL LOW (ref 26.0–34.0)
MCHC: 24.2 g/dL — ABNORMAL LOW (ref 30.0–36.0)
MCHC: 25.4 g/dL — ABNORMAL LOW (ref 30.0–36.0)
MCV: 60.9 fL — ABNORMAL LOW (ref 80.0–100.0)
MCV: 63.6 fL — ABNORMAL LOW (ref 80.0–100.0)
Platelets: 383 10*3/uL (ref 150–400)
Platelets: 358 10*3/uL (ref 150–400)
RBC: 4.55 MIL/uL (ref 4.22–5.81)
RBC: 4.4 MIL/uL (ref 4.22–5.81)
RDW: 21.2 % — ABNORMAL HIGH (ref 11.5–15.5)
RDW: 23 % — ABNORMAL HIGH (ref 11.5–15.5)
WBC: 6.3 10*3/uL (ref 4.0–10.5)
WBC: 7.8 10*3/uL (ref 4.0–10.5)
nRBC: 0 % (ref 0.0–0.2)
nRBC: 0 % (ref 0.0–0.2)

## 2023-09-28 LAB — COMPREHENSIVE METABOLIC PANEL
ALT: 18 U/L (ref 0–44)
AST: 18 U/L (ref 15–41)
Albumin: 4.6 g/dL (ref 3.5–5.0)
Alkaline Phosphatase: 91 U/L (ref 38–126)
Anion gap: 8 (ref 5–15)
BUN: 13 mg/dL (ref 6–20)
CO2: 25 mmol/L (ref 22–32)
Calcium: 9.1 mg/dL (ref 8.9–10.3)
Chloride: 104 mmol/L (ref 98–111)
Creatinine, Ser: 0.86 mg/dL (ref 0.61–1.24)
GFR, Estimated: >60 mL/min (ref >60)
Glucose, Bld: 107 mg/dL — ABNORMAL HIGH (ref 70–99)
Potassium: 3.4 mmol/L — ABNORMAL LOW (ref 3.5–5.1)
Sodium: 137 mmol/L (ref 135–145)
Total Bilirubin: 1 mg/dL (ref 0.3–1.2)
Total Protein: 7.8 g/dL (ref 6.5–8.1)

## 2023-09-28 LAB — HEMOGLOBIN AND HEMATOCRIT, BLOOD
HCT: 29.5 % — ABNORMAL LOW (ref 39.0–52.0)
Hemoglobin: 7.4 g/dL — ABNORMAL LOW (ref 13.0–17.0)

## 2023-09-28 LAB — PREPARE RBC (CROSSMATCH)

## 2023-09-28 MED ORDER — ACETAMINOPHEN 325 MG PO TABS: 650.0000 mg | ORAL_TABLET | Freq: Four times a day (QID) | ORAL | Status: DC | PRN

## 2023-09-28 MED ORDER — ONDANSETRON HCL 4 MG/2ML IJ SOLN: 4.0000 mg | Freq: Four times a day (QID) | INTRAMUSCULAR | Status: DC | PRN

## 2023-09-28 MED ORDER — ONDANSETRON HCL 4 MG PO TABS: 4.0000 mg | ORAL_TABLET | Freq: Four times a day (QID) | ORAL | Status: DC | PRN

## 2023-09-28 MED ORDER — SODIUM CHLORIDE 0.9% IV SOLUTION: Freq: Once | INTRAVENOUS | Status: AC

## 2023-09-28 MED ORDER — ACETAMINOPHEN 650 MG RE SUPP: 650.0000 mg | Freq: Four times a day (QID) | RECTAL | Status: DC | PRN

## 2023-09-28 MED ORDER — SENNOSIDES-DOCUSATE SODIUM 8.6-50 MG PO TABS: 1.0000 | ORAL_TABLET | Freq: Every evening | ORAL | Status: DC | PRN

## 2023-09-28 MED ORDER — POTASSIUM CHLORIDE CRYS ER 20 MEQ PO TBCR
40.0000 meq | EXTENDED_RELEASE_TABLET | Freq: Once | ORAL | Status: AC
  Administered 2023-09-28: 40 meq via ORAL
  Filled 2023-09-28: qty 2

## 2023-09-28 NOTE — ED Provider Triage Note (Signed)
Emergency Medicine Provider Triage Evaluation Note  Eric Petty , a 47 y.o. male  was evaluated in triage.  Pt complains of blood transfusion. History of internal hemorrhoids, states he started having bright red blood in stools last week with fatigue. Hgb of 6.8 at PCP's office yesterday.  Review of Systems  Positive: Fatigue, BRBPR Negative: Dizziness  Physical Exam  BP (!) 135/90 (BP Location: Left Arm)   Pulse (!) 109   Temp 98.4 F (36.9 C) (Oral)   Resp 18   Ht 5\' 9"  (1.753 m)   Wt 95.7 kg   SpO2 100%   BMI 31.16 kg/m  Gen:   Awake, no distress   Resp:  Normal effort  MSK:   Moves extremities without difficulty  Other:    Medical Decision Making  Medically screening exam initiated at 4:37 PM.  Appropriate orders placed.  Eric Petty was informed that the remainder of the evaluation will be completed by another provider, this initial triage assessment does not replace that evaluation, and the importance of remaining in the ED until their evaluation is complete.   Maxwell Marion, PA-C 09/28/23 [phone]

## 2023-09-28 NOTE — H&P (Signed)
History and Physical    Patient: Eric Petty ZOX:[id number] DOB: 1976/03/08 DOA: 09/28/2023 DOS: the patient was seen and examined on 09/28/2023 PCP: [facility]  Patient coming from: Home  Chief Complaint:  Chief Complaint  Patient presents with   Abnormal Lab   HPI: Eric Petty is a 47 y.o. male with medical history significant of bleeding internal hemorrhoids who presents to the ED due to anemia on routine labs at his PCP office. Patient reports that he has had bleeding from his internal hemorrhoids on and off for the last few years. He has had hematochezia consistently almost every other week which has become his norm. He reports ongoing fatigue but denies any abdominal pain, rectal pain, melena, dizziness, syncope, chest pain, visual changes or shortness of breath.  He was evaluated at his PCP for his routine labs on 10/24 and results showed a hemoglobin of 6.8 so he was advised to present to the ED for further evaluation and management of his anemia.  ED course: Repeat Hgb low at 6.7, improved to only 7.1 after 1 unit of PRBC. CMP showed mild hypokalemia of 3.4.  [facility] recommended discharge if appropriate response to transfusion but admitted for observation and possible repeat colonoscopy this patient continues to bleed.  Hospitalist consulted to admit for observation.  Review of Systems: As mentioned in the history of present illness. All other systems reviewed and are negative. Past Medical History:  Diagnosis Date   Acute lower GI bleeding    (seen in [facility] ED on 09/15/21 for this)   Past Surgical History:  Procedure Laterality Date   BIOPSY  09/25/2021   Procedure: BIOPSY;  Surgeon: Willis Modena, MD;  Location: [facility];  Service: Endoscopy;;   COLONOSCOPY N/A 09/25/2021   Procedure: COLONOSCOPY;  Surgeon: Willis Modena, MD;  Location: [facility];  Service: Endoscopy;  Laterality: N/A;   ESOPHAGOGASTRODUODENOSCOPY (EGD) WITH  PROPOFOL N/A 09/25/2021   Procedure: ESOPHAGOGASTRODUODENOSCOPY (EGD) WITH PROPOFOL;  Surgeon: Willis Modena, MD;  Location: [facility];  Service: Endoscopy;  Laterality: N/A;   Social History:  reports that he has never smoked. He has never used smokeless tobacco. He reports current alcohol use. He reports that he does not use drugs.  Allergies  Allergen Reactions   Penicillins Other (See Comments)    Unknown reaction, childhood allergy  Did it involve swelling of the face/tongue/throat, SOB, or low BP? Unknown Did it involve sudden or severe rash/hives, skin peeling, or any reaction on the inside of your mouth or nose? Unknown Did you need to seek medical attention at a hospital or doctor's office? Unknown When did it last happen?     childhood  If all above answers are "NO", may proceed with cephalosporin use.      History reviewed. No pertinent family history.  Prior to Admission medications   Medication Sig Start Date End Date Taking? Authorizing Provider  hydrocortisone (ANUSOL-HC) 25 MG suppository Place 1 suppository (25 mg total) rectally 2 (two) times daily. Patient not taking: Reported on 09/28/2023 09/25/21   Azucena Fallen, MD    Physical Exam: Vitals:   09/28/23 1930 09/28/23 2015 09/28/23 2030 09/28/23 2100  BP: 127/83 (!) 158/104  (!) 146/90  Pulse: 88  80 85  Resp: 18   18  Temp:      TempSrc:      SpO2: 99%  100% 99%  Weight:      Height:      General: Pleasant, well-appearing  middle-age man sitting in bed. No acute distress. HEENT: Pale conjunctiva. Moist mucous membrane. CV: RRR. No murmurs, rubs, or gallops. No LE edema Pulmonary: Lungs CTAB. Normal effort. No wheezing or rales. Abdominal: Soft, nontender, nondistended. Normal bowel sounds. Extremities: Palpable radial and DP pulses. Normal ROM. Skin: Warm and dry. No obvious rash or lesions. Neuro: A&Ox3. Moves all extremities. Normal sensation. No focal deficit. Psych: Normal mood and  affect  Data Reviewed:  Hgb 6.7-7.1 after 1 unit PRBC. K+ 3.4  Assessment and Plan:  # Lower GI bleed # Hx internal hemorrhoids Patient reports history of internal hemorrhoids since his 70s. Patient was admitted for symptomatic anemia and found to have bleeding internal hemorrhoids on colonoscopy 2 years ago. He has continued to have hematochezia on and off since then. He reports nonadherence to his prescribed iron supplements. He remains hemodynamically stable without active bleeding. Follows [facility], who have been consulted.  Possible inpatient colonoscopy if hemoglobin does not improve versus discharge home for outpatient follow-up if stable hemoglobin.  -GI following, appreciate recs -Trend CBC  # Acute on chronic anemia # Microcytic anemia # Iron deficiency anemia Patient with a history of chronic anemia from bleeding internal hemorrhoids with baseline hemoglobin around 9 based on chart review. Found to have hemoglobin down to 6.8 in the outpatient likely due to slow chronic blood loss. Pt w/ history of iron deficiency anemia and prescribed iron supplements but reports he has not been taking it. S/p 1 unit PRBC with only slight rise from 6.7-7.1. Due to lack of appropriate response, will get H&H and consider repeat transfusion if below 7. -Follow-up stat H&H -Iron studies, ferritin -Transfuse for hgb >7 -Follow-up morning CBC  # Hypokalemia K+ mildly low at 3.4 -Replete with KCl 40 mEq x 1 -Morning BMP   Advance Care Planning:   Code Status: Full Code   Consults: Gastroenterology  Family Communication: No family at bedside  Severity of Illness: The appropriate patient status for this patient is OBSERVATION. Observation status is judged to be reasonable and necessary in order to provide the required intensity of service to ensure the patient's safety. The patient's presenting symptoms, physical exam findings, and initial radiographic and laboratory data in the context of  their medical condition is felt to place them at decreased risk for further clinical deterioration. Furthermore, it is anticipated that the patient will be medically stable for discharge from the hospital within 2 midnights of admission.   Author: Steffanie Rainwater, MD 09/28/2023 10:19 PM  For on call review [email or web address].

## 2023-09-28 NOTE — ED Provider Notes (Signed)
[place] EMERGENCY DEPARTMENT AT [facility] Provider Note   CSN: [id number] Arrival date & time: 09/28/23  1541     History  Chief Complaint  Patient presents with   Abnormal Lab    XAIVER SEEKINS is a 47 y.o. male.   Abnormal Lab 47 year old male with history of chronic anemia as well as prior lower GI bleeding presenting for abnormal labs.  Patient states he had routine labs done by his PCP yesterday with a hemoglobin of 6.8.  Looks like his baseline is around 9.  He was admitted in 2022 after having hematochezia.  [facility] evaluated him, he had a colonoscopy which showed internal hemorrhoids.  He received 2 units of packed red blood cells.  Patient states over last few days has had some intermittent hematochezia.  No pain at all.  No melena.  He suspects it is due to his internal hemorrhoids.  He feels fatigued but denies any dizziness or syncope.  No chest pain or shortness of breath or fevers or belly pain.     Home Medications Prior to Admission medications   Medication Sig Start Date End Date Taking? Authorizing Provider  hydrocortisone (ANUSOL-HC) 25 MG suppository Place 1 suppository (25 mg total) rectally 2 (two) times daily. Patient not taking: Reported on 09/28/2023 09/25/21   Azucena Fallen, MD      Allergies    Penicillins    Review of Systems   Review of Systems Review of systems completed and notable as per HPI.  ROS otherwise negative.   Physical Exam Updated Vital Signs BP (!) 146/90   Pulse 85   Temp 98.8 F (37.1 C)   Resp 18   Ht 5\' 9"  (1.753 m)   Wt 95.7 kg   SpO2 99%   BMI 31.16 kg/m  Physical Exam Vitals and nursing note reviewed.  Constitutional:      General: He is not in acute distress.    Appearance: He is well-developed.  HENT:     Head: Normocephalic and atraumatic.     Mouth/Throat:     Mouth: Mucous membranes are moist.     Pharynx: Oropharynx is clear.  Eyes:     Extraocular Movements: Extraocular  movements intact.     Conjunctiva/sclera: Conjunctivae normal.     Pupils: Pupils are equal, round, and reactive to light.  Cardiovascular:     Rate and Rhythm: Regular rhythm. Tachycardia present.     Heart sounds: No murmur heard. Pulmonary:     Effort: Pulmonary effort is normal. No respiratory distress.     Breath sounds: Normal breath sounds.  Abdominal:     Palpations: Abdomen is soft.     Tenderness: There is no abdominal tenderness. There is no guarding or rebound.  Musculoskeletal:        General: No swelling.     Cervical back: Neck supple.     Right lower leg: No edema.     Left lower leg: No edema.  Skin:    General: Skin is warm and dry.     Capillary Refill: Capillary refill takes less than 2 seconds.  Neurological:     Mental Status: He is alert.  Psychiatric:        Mood and Affect: Mood normal.     ED Results / Procedures / Treatments   Labs (all labs ordered are listed, but only abnormal results are displayed) Labs Reviewed  CBC - Abnormal; Notable for the following components:  Result Value   Hemoglobin 6.7 (*)    HCT 27.7 (*)    MCV 60.9 (*)    MCH 14.7 (*)    MCHC 24.2 (*)    RDW 21.2 (*)    All other components within normal limits  COMPREHENSIVE METABOLIC PANEL - Abnormal; Notable for the following components:   Potassium 3.4 (*)    Glucose, Bld 107 (*)    All other components within normal limits  CBC - Abnormal; Notable for the following components:   Hemoglobin 7.1 (*)    HCT 28.0 (*)    MCV 63.6 (*)    MCH 16.1 (*)    MCHC 25.4 (*)    RDW 23.0 (*)    All other components within normal limits  HEMOGLOBIN AND HEMATOCRIT, BLOOD - Abnormal; Notable for the following components:   Hemoglobin 7.4 (*)    HCT 29.5 (*)    All other components within normal limits  CBC  BASIC METABOLIC PANEL  FERRITIN  HIV ANTIBODY (ROUTINE TESTING W REFLEX)  IRON AND TIBC  TYPE AND SCREEN  PREPARE RBC (CROSSMATCH)    EKG None  Radiology No  results found.  Procedures Procedures    Medications Ordered in ED Medications  acetaminophen (TYLENOL) tablet 650 mg (has no administration in time range)    Or  acetaminophen (TYLENOL) suppository 650 mg (has no administration in time range)  senna-docusate (Senokot-S) tablet 1 tablet (has no administration in time range)  ondansetron (ZOFRAN) tablet 4 mg (has no administration in time range)    Or  ondansetron (ZOFRAN) injection 4 mg (has no administration in time range)  0.9 %  sodium chloride infusion (Manually program via Guardrails IV Fluids) (0 mLs Intravenous Stopped 09/28/23 1827)  potassium chloride SA (KLOR-CON M) CR tablet 40 mEq (40 mEq Oral Given 09/28/23 2310)    ED Course/ Medical Decision Making/ A&P Clinical Course as of 09/28/23 2317  Fri Sep 28, 2023  1751 Dr. [facility]: could consider iron supplementation his anemia may not just be from bleeding, OP follow up if count improves and bleeding controlled, will need urgent follow up, message if needs inpatient/Sunday colonoscopy [JD]  1910 On reassessment he feels well.  He feels better after blood transfusion.  Repeat CBC is pending.  Tachycardia has resolved.  No additional bleeding. [JD]    Clinical Course User Index [JD] Laurence Spates, MD                                 Medical Decision Making Amount and/or Complexity of Data Reviewed Labs: ordered.  Risk Prescription drug management. Decision regarding hospitalization.   Medical Decision Making:   LANORRIS KRAJICEK is a 47 y.o. male who presented to the ED today with anemia on routine labs.  Vital signs normal for mild tachycardia.  Exam is overall well-appearing, reports several days of intermittent hematochezia.  No melena.  Likely lower GI bleed.  He has history of prior symptomatic anemia and lower GI bleeding from internal hemorrhoid.  He has no pain with no rectal pain or abdominal pain with benign abdominal exam.  No melena although  suspicion for upper GI bleed.   Patient placed on continuous vitals and telemetry monitoring while in ED which was reviewed periodically.  Reviewed and confirmed nursing documentation for past medical history, family history, social history.  Reassessment and Plan:   Lab work here consistent with acute  on chronic anemia.  Baseline appears to be in the nines.  Consent obtained from patient for blood transfusion, patient transfused.  He had resolution of tachycardia and is feeling better.  I talked with Dr. Lorenso Quarry with [facility].  She wonders if his symptoms could be related to chronic iron deficiency anemia for which she is not taking iron as well as some superimposed bleeding causing worsening.  She felt give his blood count improved appropriately and his bleeding has stopped she could have a follow-up urgently versus having him come to the hospital for possible repeat colonoscopy on Sunday.  On reassessment patient feels well, however his repeat CBC has a hemoglobin that went up only 0.4 points.  No more bloody bowel movements here.  He is stable, however I am concerned that his hemoglobin did not appropriately respond to concern for possible ongoing blood loss anemia.  I discussed the patient with Dr. Kirke Corin and he was admitted for further management.  Patient was comfortable this plan.  He is not anticoagulated.  I did notify [facility] that he was admitted, they will evaluate tomorrow.   Patient's presentation is most consistent with acute complicated illness / injury requiring diagnostic workup.           Final Clinical Impression(s) / ED Diagnoses Final diagnoses:  Symptomatic anemia  Lower GI bleeding    Rx / DC Orders ED Discharge Orders     None         Laurence Spates, MD 09/28/23 2317

## 2023-09-28 NOTE — ED Triage Notes (Signed)
Patient sent by PCP for Hgb of 6.8. Reports he has a bleeding internal hemorrhoid that needs repair and has needed blood transfusions in the past. Reports chronic fatigue. Denies pain and SOB.

## 2023-09-29 DIAGNOSIS — K922 Gastrointestinal hemorrhage, unspecified: Principal | ICD-10-CM

## 2023-09-29 LAB — CBC
HCT: 27.8 % — ABNORMAL LOW (ref 39.0–52.0)
Hemoglobin: 7 g/dL — ABNORMAL LOW (ref 13.0–17.0)
MCH: 15.9 pg — ABNORMAL LOW (ref 26.0–34.0)
MCHC: 25.2 g/dL — ABNORMAL LOW (ref 30.0–36.0)
MCV: 63 fL — ABNORMAL LOW (ref 80.0–100.0)
Platelets: 348 10*3/uL (ref 150–400)
RBC: 4.41 MIL/uL (ref 4.22–5.81)
RDW: 22.7 % — ABNORMAL HIGH (ref 11.5–15.5)
WBC: 7.1 10*3/uL (ref 4.0–10.5)
nRBC: 0 % (ref 0.0–0.2)

## 2023-09-29 LAB — PREPARE RBC (CROSSMATCH)

## 2023-09-29 LAB — BASIC METABOLIC PANEL
Anion gap: 7 (ref 5–15)
BUN: 13 mg/dL (ref 6–20)
CO2: 24 mmol/L (ref 22–32)
Calcium: 8.7 mg/dL — ABNORMAL LOW (ref 8.9–10.3)
Chloride: 106 mmol/L (ref 98–111)
Creatinine, Ser: 0.74 mg/dL (ref 0.61–1.24)
GFR, Estimated: >60 mL/min (ref >60)
Glucose, Bld: 98 mg/dL (ref 70–99)
Potassium: 4 mmol/L (ref 3.5–5.1)
Sodium: 137 mmol/L (ref 135–145)

## 2023-09-29 LAB — IRON AND TIBC
Iron: 18 ug/dL — ABNORMAL LOW (ref 45–182)
Saturation Ratios: 3 % — ABNORMAL LOW (ref 17.9–39.5)
TIBC: 531 ug/dL — ABNORMAL HIGH (ref 250–450)
UIBC: 513 ug/dL

## 2023-09-29 LAB — FERRITIN: Ferritin: 3 ng/mL — ABNORMAL LOW (ref 24–336)

## 2023-09-29 LAB — HIV ANTIBODY (ROUTINE TESTING W REFLEX): HIV Screen 4th Generation wRfx: NONREACTIVE

## 2023-09-29 MED ORDER — HYDROCORTISONE ACETATE 25 MG RE SUPP: 25.0000 mg | Freq: Two times a day (BID) | RECTAL | 0 refills | Status: AC

## 2023-09-29 MED ORDER — SENNOSIDES-DOCUSATE SODIUM 8.6-50 MG PO TABS: 1.0000 | ORAL_TABLET | Freq: Every day | ORAL | 0 refills | Status: AC

## 2023-09-29 MED ORDER — SODIUM CHLORIDE 0.9% IV SOLUTION: Freq: Once | INTRAVENOUS | Status: DC

## 2023-09-29 MED ORDER — IRON (FERROUS SULFATE) 325 (65 FE) MG PO TABS: 1.0000 | ORAL_TABLET | Freq: Two times a day (BID) | ORAL | 2 refills | Status: AC

## 2023-09-29 NOTE — Consult Note (Signed)
[facility] Gastroenterology Consult  Referring Provider: No ref. provider found Primary Care Physician:  [facility] Primary Gastroenterologist: Gentry Fitz  Reason for Consultation: Rectal bleeding, anemia  SUBJECTIVE:   HPI: Eric Petty is a 47 y.o. male with past medical history significant for iron deficiency anemia and lower GI bleeding in October 2022 having undergone EGD and colonoscopy with findings of internal hemorrhoids.  Presented to his PCP for annual health physical, showed anemia with hemoglobin less than 7, recommended to present to the emergency department for evaluation.  Patient denied any prior to admission medications, denied NSAID use, denied iron supplementation.  He has been experiencing intermittent bright red blood per rectum which he attributes to hemorrhoid.  At times the blood per rectum has been significant in quantity.  He denied any rectal discomfort.  No abdominal pain.  No nausea or vomiting.  No chest pain or shortness of breath.  No lightheadedness or dizziness. He noted that last bowel movement prior to admission had blood. No bowel movement since presentation to hospital. No prior abdominal surgeries.  Labs on presentation showed hemoglobin 6.7 (was 9.6 on 09/25/2021), WBC 6.3, platelet 383, iron deficiency.  EGD and colonoscopy completed 09/25/2021 (Dr. Dulce Sellar) showed duodenal bulb erosions, biopsies were negative for celiac disease), D1 and D2 were normal, moderate size internal hemorrhoids, distal 10 cm of the terminal ileum was within normal limits.  Past Medical History:  Diagnosis Date   Acute lower GI bleeding    (seen in [facility] ED on 09/15/21 for this)   Past Surgical History:  Procedure Laterality Date   BIOPSY  09/25/2021   Procedure: BIOPSY;  Surgeon: Willis Modena, MD;  Location: [facility];  Service: Endoscopy;;   COLONOSCOPY N/A 09/25/2021   Procedure: COLONOSCOPY;  Surgeon: Willis Modena, MD;  Location: [facility];  Service: Endoscopy;  Laterality: N/A;   ESOPHAGOGASTRODUODENOSCOPY (EGD) WITH PROPOFOL N/A 09/25/2021   Procedure: ESOPHAGOGASTRODUODENOSCOPY (EGD) WITH PROPOFOL;  Surgeon: Willis Modena, MD;  Location: [facility];  Service: Endoscopy;  Laterality: N/A;   Prior to Admission medications   Medication Sig Start Date End Date Taking? Authorizing Provider  hydrocortisone (ANUSOL-HC) 25 MG suppository Place 1 suppository (25 mg total) rectally 2 (two) times daily. Patient not taking: Reported on 09/28/2023 09/25/21   Azucena Fallen, MD   Current Facility-Administered Medications  Medication Dose Route Frequency Provider Last Rate Last Admin   0.9 %  sodium chloride infusion (Manually program via Guardrails IV Fluids)   Intravenous Once Dorcas Carrow, MD       acetaminophen (TYLENOL) tablet 650 mg  650 mg Oral Q6H PRN Steffanie Rainwater, MD       Or   acetaminophen (TYLENOL) suppository 650 mg  650 mg Rectal Q6H PRN Steffanie Rainwater, MD       ondansetron [facility]) tablet 4 mg  4 mg Oral Q6H PRN Steffanie Rainwater, MD       Or   ondansetron (ZOFRAN) injection 4 mg  4 mg Intravenous Q6H PRN Steffanie Rainwater, MD       senna-docusate (Senokot-S) tablet 1 tablet  1 tablet Oral QHS PRN Steffanie Rainwater, MD       Current Outpatient Medications  Medication Sig Dispense Refill   hydrocortisone (ANUSOL-HC) 25 MG suppository Place 1 suppository (25 mg total) rectally 2 (two) times daily. (Patient not taking: Reported on 09/28/2023) 12 suppository 0   Allergies as of 09/28/2023 - Review Complete 09/28/2023  Allergen Reaction Noted  Penicillins Other (See Comments) 07/21/2018   History reviewed. No pertinent family history. Social History   Socioeconomic History   Marital status: Married    Spouse name: Not on file   Number of children: Not on file   Years of education: Not on file   Highest education level: Not on file  Occupational History   Not on file  Tobacco  Use   Smoking status: Never   Smokeless tobacco: Never  Substance and Sexual Activity   Alcohol use: Yes    Comment: social; 2-3 beers on 2 occassions per month   Drug use: Never   Sexual activity: Not on file  Other Topics Concern   Not on file  Social History Narrative   Not on file   Social Determinants of Health   Financial Resource Strain: Not on file  Food Insecurity: Not on file  Transportation Needs: Not on file  Physical Activity: Not on file  Stress: Not on file  Social Connections: Not on file  Intimate Partner Violence: Not on file   Review of Systems:  Review of Systems  Constitutional:  Negative for malaise/fatigue and weight loss.  Respiratory:  Negative for shortness of breath.   Cardiovascular:  Negative for chest pain.  Gastrointestinal:  Negative for abdominal pain, nausea and vomiting.       Blood per rectum    OBJECTIVE:   Temp:  [97.6 F (36.4 C)-98.8 F (37.1 C)] 98.4 F (36.9 C) (10/26 0748) Pulse Rate:  [71-109] 76 (10/26 0748) Resp:  [16-18] 16 (10/26 0748) BP: (104-158)/(65-104) 121/77 (10/26 0748) SpO2:  [97 %-100 %] 98 % (10/26 0748) Weight:  [95.7 kg] 95.7 kg (10/25 1605)   Physical Exam Constitutional:      General: He is not in acute distress.    Appearance: He is not ill-appearing, toxic-appearing or diaphoretic.  Cardiovascular:     Rate and Rhythm: Normal rate and regular rhythm.  Pulmonary:     Effort: No respiratory distress.     Breath sounds: Normal breath sounds.  Abdominal:     General: Bowel sounds are normal. There is no distension.     Palpations: Abdomen is soft.     Tenderness: There is no abdominal tenderness. There is no guarding.  Genitourinary:    Comments: Rectal examination completed with chaperone (April RN) present at bedside. No external hemorrhoids, mass lesions on external inspection. Possible medium sized internal hemorrhoids most pronounced on left rectal wall on digital rectal examination. No mass  lesions palpated. No gross blood on examining finger. Musculoskeletal:     Right lower leg: No edema.     Left lower leg: No edema.  Skin:    General: Skin is warm and dry.  Neurological:     Mental Status: He is alert.     Labs: Recent Labs    09/28/23 1618 09/28/23 1923 09/28/23 2107 09/29/23 0558  WBC 6.3 7.8  --  7.1  HGB 6.7* 7.1* 7.4* 7.0*  HCT 27.7* 28.0* 29.5* 27.8*  PLT 383 358  --  348   BMET Recent Labs    09/28/23 1618 09/29/23 0558  NA 137 137  K 3.4* 4.0  CL 104 106  CO2 25 24  GLUCOSE 107* 98  BUN 13 13  CREATININE 0.86 0.74  CALCIUM 9.1 8.7*   LFT Recent Labs    09/28/23 1618  PROT 7.8  ALBUMIN 4.6  AST 18  ALT 18  ALKPHOS 91  BILITOT 1.0   PT/INR No  results for input(s): "LABPROT", "INR" in the last 72 hours.  Diagnostic imaging: No results found.  IMPRESSION: Bright red blood per rectum Iron deficiency anemia  PLAN: -Patient has undergone PRBC transfusion with increase in serum Hgb though not as robust as expected, may need repeat PRBC transfusion today -Discussed considerations of repeat endoscopy with patient, he would prefer to investigate hemorrhoid banding first as he believes this is the cause of his bleeding and anemia -Have reached out to IM, consider consult General Surgery for banding during inpatient stay given profound anemia, though recommendations may be to complete in outpatient setting -Would recommend oral iron supplementation daily  -No plans for endoscopic evaluation during current hospital stay, recommend outpatient follow up with [facility] to discuss next steps in evaluation (including potential repeat colonoscopy versus video capsule endoscopy) -[facility] will sign off and be available for questions that arise   LOS: 0 days   Liliane Shi, DO [facility] Gastroenterology

## 2023-09-29 NOTE — Discharge Summary (Signed)
Physician Discharge Summary  Eric Petty:[id number] DOB: 15-Mar-1976 DOA: 09/28/2023  PCP: [facility]  Admit date: 09/28/2023 Discharge date: 09/29/2023  Admitted From: Home Disposition: Home  Recommendations for Outpatient Follow-up:  Follow up with PCP in 1-2 weeks Please obtain BMP/CBC in one week Will send referral to surgery, please call for appointment   Discharge Condition: Stable CODE STATUS: Full code Diet recommendation: Regular diet, more fibers.  Avoid constipation.  Discharge summary: 47 year old with history of chronic iron-deficiency anemia thought to be secondary to intermittently bleeding internal hemorrhoids.  He went to PCP office for annual physical and hemoglobin was 6.7.  He was sent to ER for blood transfusion and evaluation.  EGD and colonoscopy completed 2 years ago with moderate size internal hemorrhoids.  Patient with intermittent fresh rectal bleeding.  In the emergency room, he was given 2 units of blood transfusion.  Patient remains asymptomatic.  Last bowel movements without bleeding.  Seen by gastroenterology.  Case discussed with surgery.  Plan: Able to go home after 2 units of PRBC today. Does not have active bleeding, advised to continue bowel regimen to avoid constipation, use steroid suppository, iron supplementation. GI will schedule follow-up. Currently not requiring surgery, however he may benefit with follow-up with colorectal surgery if continues to have bleeding for internal banding. Stable for discharge.   Discharge Diagnoses:  Principal Problem:   Lower GI bleed    Discharge Instructions  Discharge Instructions     Diet general   Complete by: As directed    Discharge instructions   Complete by: As directed    Take additional laxatives if needed to avoid constipation. Recheck hemoglobin in 1 week. Call surgical office and the number provided to make an appointment.   Increase activity slowly    Complete by: As directed       Allergies as of 09/29/2023       Reactions   Penicillins Other (See Comments)   Unknown reaction, childhood allergy Did it involve swelling of the face/tongue/throat, SOB, or low BP? Unknown Did it involve sudden or severe rash/hives, skin peeling, or any reaction on the inside of your mouth or nose? Unknown Did you need to seek medical attention at a hospital or doctor's office? Unknown When did it last happen?     childhood  If all above answers are "NO", may proceed with cephalosporin use.        Medication List     TAKE these medications    hydrocortisone 25 MG suppository Commonly known as: ANUSOL-HC Place 1 suppository (25 mg total) rectally 2 (two) times daily.   Iron (Ferrous Sulfate) 325 (65 Fe) MG Tabs Take 1 tablet by mouth 2 (two) times daily.   senna-docusate 8.6-50 MG tablet Commonly known as: Senokot-S Take 1 tablet by mouth at bedtime.        Follow-up Information     [facility], [place]. Call.   Specialty: General Surgery Why: call to make an appointment Contact information: [address] [place] [address] [phone]               Allergies  Allergen Reactions   Penicillins Other (See Comments)    Unknown reaction, childhood allergy  Did it involve swelling of the face/tongue/throat, SOB, or low BP? Unknown Did it involve sudden or severe rash/hives, skin peeling, or any reaction on the inside of your mouth or nose? Unknown Did you need to seek medical attention at  a hospital or doctor's office? Unknown When did it last happen?     childhood  If all above answers are "NO", may proceed with cephalosporin use.      Consultations: Gastroenterology   Procedures/Studies: No results found. (Echo, Carotid, EGD, Colonoscopy, ERCP)    Subjective: Patient seen in the morning rounds.  He was still in the emergency room.  Denies any complaints.  He  received 1 unit of PRBC overnight, hemoglobin was 7.1.  Wrote him 1 more unit of PRBC before discharge.  He will need follow-up hemoglobin in 1 week.   Discharge Exam: Vitals:   09/29/23 1100 09/29/23 1300  BP: 120/85 119/81  Pulse: 90 66  Resp:  18  Temp:  98.5 F (36.9 C)  SpO2: 100% 100%   Vitals:   09/29/23 1012 09/29/23 1027 09/29/23 1100 09/29/23 1300  BP: 122/81 119/79 120/85 119/81  Pulse: 75 82 90 66  Resp: 18 18  18   Temp: 97.8 F (36.6 C) 98.3 F (36.8 C)  98.5 F (36.9 C)  TempSrc: Oral Oral  Oral  SpO2:  97% 100% 100%  Weight:      Height:        General: Pt is alert, awake, not in acute distress Cardiovascular: RRR, S1/S2 +, no rubs, no gallops Respiratory: CTA bilaterally, no wheezing, no rhonchi Abdominal: Soft, NT, ND, bowel sounds + Extremities: no edema, no cyanosis Rectal exam documented by gastroenterologist, not done by me.    The results of significant diagnostics from this hospitalization (including imaging, microbiology, ancillary and laboratory) are listed below for reference.     Microbiology: No results found for this or any previous visit (from the past 240 hour(s)).   Labs: BNP (last 3 results) No results for input(s): "BNP" in the last 8760 hours. Basic Metabolic Panel: Recent Labs  Lab 09/28/23 1618 09/29/23 0558  NA 137 137  K 3.4* 4.0  CL 104 106  CO2 25 24  GLUCOSE 107* 98  BUN 13 13  CREATININE 0.86 0.74  CALCIUM 9.1 8.7*   Liver Function Tests: Recent Labs  Lab 09/28/23 1618  AST 18  ALT 18  ALKPHOS 91  BILITOT 1.0  PROT 7.8  ALBUMIN 4.6   No results for input(s): "LIPASE", "AMYLASE" in the last 168 hours. No results for input(s): "AMMONIA" in the last 168 hours. CBC: Recent Labs  Lab 09/28/23 1618 09/28/23 1923 09/28/23 2107 09/29/23 0558  WBC 6.3 7.8  --  7.1  HGB 6.7* 7.1* 7.4* 7.0*  HCT 27.7* 28.0* 29.5* 27.8*  MCV 60.9* 63.6*  --  63.0*  PLT 383 358  --  348   Cardiac Enzymes: No results  for input(s): "CKTOTAL", "CKMB", "CKMBINDEX", "TROPONINI" in the last 168 hours. BNP: Invalid input(s): "POCBNP" CBG: No results for input(s): "GLUCAP" in the last 168 hours. D-Dimer No results for input(s): "DDIMER" in the last 72 hours. Hgb A1c No results for input(s): "HGBA1C" in the last 72 hours. Lipid Profile No results for input(s): "CHOL", "HDL", "LDLCALC", "TRIG", "CHOLHDL", "LDLDIRECT" in the last 72 hours. Thyroid function studies No results for input(s): "TSH", "T4TOTAL", "T3FREE", "THYROIDAB" in the last 72 hours.  Invalid input(s): "FREET3" Anemia work up Recent Labs    09/29/23 0558  FERRITIN 3*  TIBC 531*  IRON 18*   Urinalysis    Component Value Date/Time   COLORURINE YELLOW 09/23/2021 2200   APPEARANCEUR CLEAR 09/23/2021 2200   LABSPEC 1.027 09/23/2021 2200   PHURINE 7.0 09/23/2021 2200  GLUCOSEU NEGATIVE 09/23/2021 2200   HGBUR NEGATIVE 09/23/2021 2200   BILIRUBINUR NEGATIVE 09/23/2021 2200   KETONESUR NEGATIVE 09/23/2021 2200   PROTEINUR NEGATIVE 09/23/2021 2200   NITRITE NEGATIVE 09/23/2021 2200   LEUKOCYTESUR NEGATIVE 09/23/2021 2200   Sepsis Labs Recent Labs  Lab 09/28/23 1618 09/28/23 1923 09/29/23 0558  WBC 6.3 7.8 7.1   Microbiology No results found for this or any previous visit (from the past 240 hour(s)).   Time coordinating discharge: 32 minutes  SIGNED:   Dorcas Carrow, MD  [facility] 09/29/2023, 1:13 PM

## 2023-10-01 LAB — TYPE AND SCREEN
ABO/RH(D): B POS
Antibody Screen: NEGATIVE
Unit division: 0
Unit division: 0

## 2023-10-01 LAB — BPAM RBC
Blood Product Expiration Date: 202411172359
Blood Product Expiration Date: 202411072359
ISSUE DATE / TIME: 202410251741
ISSUE DATE / TIME: 202410260955
Unit Type and Rh: 1700
Unit Type and Rh: 7300
